# Patient Record
Sex: Male | Born: 1970 | Race: White | Hispanic: No | Smoking: Current every day smoker
Health system: Southern US, Community
[De-identification: ages and names within clinical notes are randomized; demographics above are authoritative.]

## PROBLEM LIST (undated history)

## (undated) DIAGNOSIS — J449 Chronic obstructive pulmonary disease, unspecified: Secondary | ICD-10-CM

## (undated) HISTORY — PX: OTHER SURGICAL HISTORY: SHX169

## (undated) HISTORY — DX: Chronic obstructive pulmonary disease, unspecified: J44.9

---

## 2006-09-22 ENCOUNTER — Emergency Department (HOSPITAL_COMMUNITY): Admission: EM | Admit: 2006-09-22 | Discharge: 2006-09-22 | Payer: Self-pay | Admitting: Emergency Medicine

## 2011-03-09 HISTORY — PX: COLONOSCOPY: SHX174

## 2014-02-21 ENCOUNTER — Encounter: Payer: Self-pay | Admitting: Cardiology

## 2014-02-21 HISTORY — PX: ESOPHAGOGASTRODUODENOSCOPY: SHX1529

## 2019-11-23 ENCOUNTER — Other Ambulatory Visit: Payer: Self-pay

## 2019-11-23 ENCOUNTER — Other Ambulatory Visit: Payer: Self-pay | Admitting: Podiatry

## 2019-11-23 ENCOUNTER — Ambulatory Visit (INDEPENDENT_AMBULATORY_CARE_PROVIDER_SITE_OTHER): Admitting: Podiatry

## 2019-11-23 DIAGNOSIS — S9782XA Crushing injury of left foot, initial encounter: Secondary | ICD-10-CM | POA: Diagnosis not present

## 2019-11-23 DIAGNOSIS — S92355A Nondisplaced fracture of fifth metatarsal bone, left foot, initial encounter for closed fracture: Secondary | ICD-10-CM | POA: Diagnosis not present

## 2019-11-23 DIAGNOSIS — M79672 Pain in left foot: Secondary | ICD-10-CM | POA: Diagnosis not present

## 2019-11-23 MED ORDER — OXYCODONE HCL 5 MG PO TABS: 5.0000 mg | ORAL_TABLET | ORAL | 0 refills | Status: DC | PRN

## 2019-11-23 NOTE — Progress Notes (Signed)
  Subjective:  Patient ID: Ivan Brown, male    DOB: 07-27-1971,  MRN: [id number]  Chief Complaint  Patient presents with  . Foot Injury    Lt foot lateral side injury (caught foot in a metal) x 1 dya; 8-9/10 shapr constatn pains -was seen at [facility] ED was rx oxycodone and dx with a fx Tx: crutches -pt in a splint   48 y.o. male presents with the above complaint. Work related injury.  Review of Systems: Negative except as noted in the HPI. Denies N/V/F/Ch.  No past medical history on file.  Current Outpatient Medications:  .  gabapentin (NEURONTIN) 300 MG capsule, TAKE 1 CAPSULE BY MOUTH EVERY DAY NIGHTLY FOR 7 DAYS. THEN INCREASE TO 2 CAPSULES BY MOUTH NIGHTLY, Disp: , Rfl:  .  hydrOXYzine (VISTARIL) 50 MG capsule, Take 50 mg by mouth at bedtime as needed., Disp: , Rfl:  .  LINZESS 290 MCG CAPS capsule, PLEASE SEE ATTACHED FOR DETAILED DIRECTIONS, Disp: , Rfl:  .  oxyCODONE (ROXICODONE) 5 MG immediate release tablet, Take 1 tablet (5 mg total) by mouth every 4 (four) hours as needed for severe pain., Disp: 12 tablet, Rfl: 0  Social History   Tobacco Use  Smoking Status Not on file    Allergies  Allergen Reactions  . Nsaids    Objective:  There were no vitals filed for this visit. There is no height or weight on file to calculate BMI. Constitutional Well developed. Well nourished.  Vascular Dorsalis pedis pulses palpable bilaterally. Posterior tibial pulses palpable bilaterally. Capillary refill normal to all digits.  No cyanosis or clubbing noted. Pedal hair growth normal.  Neurologic Normal speech. Oriented to person, place, and time. Epicritic sensation to light touch grossly present bilaterally.  Dermatologic Nails well groomed and normal in appearance. No open wounds. No skin lesions.  Orthopedic: Bruising contusion lateral foot left. POP L 5th met base. Decreased strength of peroneals 2/2 pain but appears intact.   Radiographs: Prior XR reviewed suspect fracture 5th  met base  Assessment:   1. Closed nondisplaced fracture of fifth metatarsal bone of left foot, initial encounter   2. Pain in left foot   3. Crushing injury of left foot, initial encounter    Plan:  Patient was evaluated and treated and all questions answered.  Fracture of left 5th metatarsal -XR reviewed as above -WB Status: WBAT in CAM boot. Boot dispensed. -Would benefit from non-operative management for this injury. Discussed that should the fracture appear unstable he may benefit from operative management.  Procedure: Soft Cast Application with Unna Boot Rationale: above injury Technique: Unna boot, cast padding, Coban compression dressing applied Disposition: Patient tolerated procedure well. NV status intact post-application.   Return in about 2 weeks (around 12/07/2019) for Left foot metatarsal fracture f/u; with XRs.

## 2019-11-24 ENCOUNTER — Telehealth: Payer: Self-pay | Admitting: *Deleted

## 2019-11-24 NOTE — Telephone Encounter (Signed)
That;s fine can you write the note?

## 2019-11-24 NOTE — Telephone Encounter (Signed)
Patient called wanting to know if we could change his out of work status to light duty in a seated position?  Patient stated that he did not turn in his injury to his employer for workman's comp.  Please advise.

## 2019-11-25 ENCOUNTER — Ambulatory Visit: Payer: Self-pay | Admitting: Podiatry

## 2019-11-26 ENCOUNTER — Telehealth: Payer: Self-pay | Admitting: Podiatry

## 2019-11-26 NOTE — Telephone Encounter (Signed)
Pt called to say that he is having severe pain and a lot of swelling in foot pt was seen on 11/25/2019 in [place] pt wants to know if he needs to be seen and if someone will call him back at this number [phone]

## 2019-11-27 ENCOUNTER — Encounter: Payer: Self-pay | Admitting: Podiatry

## 2019-11-27 MED ORDER — OXYCODONE-ACETAMINOPHEN 10-325 MG PO TABS: 1.0000 | ORAL_TABLET | Freq: Three times a day (TID) | ORAL | 0 refills | Status: AC | PRN

## 2019-11-29 ENCOUNTER — Encounter: Payer: Self-pay | Admitting: Gastroenterology

## 2019-11-29 ENCOUNTER — Telehealth: Payer: Self-pay | Admitting: Urology

## 2019-11-29 NOTE — Telephone Encounter (Signed)
Pt called and left a message that foot is numb and toes are white.  Tried to call pt back but no answer

## 2019-11-29 NOTE — Telephone Encounter (Signed)
Advised patient to remove [employer]. We can move up his appt.

## 2019-12-07 ENCOUNTER — Ambulatory Visit (INDEPENDENT_AMBULATORY_CARE_PROVIDER_SITE_OTHER)

## 2019-12-07 ENCOUNTER — Other Ambulatory Visit: Payer: Self-pay

## 2019-12-07 ENCOUNTER — Other Ambulatory Visit: Payer: Self-pay | Admitting: Podiatry

## 2019-12-07 ENCOUNTER — Ambulatory Visit (INDEPENDENT_AMBULATORY_CARE_PROVIDER_SITE_OTHER): Admitting: Podiatry

## 2019-12-07 DIAGNOSIS — M79672 Pain in left foot: Secondary | ICD-10-CM

## 2019-12-07 DIAGNOSIS — S92355A Nondisplaced fracture of fifth metatarsal bone, left foot, initial encounter for closed fracture: Secondary | ICD-10-CM

## 2019-12-10 ENCOUNTER — Telehealth: Payer: Self-pay | Admitting: Podiatry

## 2019-12-10 NOTE — Telephone Encounter (Signed)
Called and asked Nunzio Cory if she could fax me a request with pt's workers comp claim number to met at [phone] and then I would fax her his records. Confirmed his next appointment with [place] is 12/20/19 at 3 pm.

## 2019-12-10 NOTE — Telephone Encounter (Signed)
I'm calling to get medical records on Mr. Ivan Brown in regards to his workers compensation claim. Please call me back at [phone].

## 2019-12-13 ENCOUNTER — Telehealth: Payer: Self-pay | Admitting: Podiatry

## 2019-12-13 NOTE — Telephone Encounter (Signed)
I'm calling because I called and sent a fax to obtain [name] medical records on Friday 18 December. I told Lorel Monaco waiting on the doctor to finish dictating ov note from 15 December and once its completed I would get it faxed to her.

## 2019-12-20 ENCOUNTER — Other Ambulatory Visit: Payer: Self-pay | Admitting: Podiatry

## 2019-12-20 ENCOUNTER — Ambulatory Visit (INDEPENDENT_AMBULATORY_CARE_PROVIDER_SITE_OTHER): Payer: BLUE CROSS/BLUE SHIELD | Admitting: Podiatry

## 2019-12-20 DIAGNOSIS — M79672 Pain in left foot: Secondary | ICD-10-CM

## 2019-12-20 DIAGNOSIS — Z5329 Procedure and treatment not carried out because of patient's decision for other reasons: Secondary | ICD-10-CM

## 2019-12-20 NOTE — Progress Notes (Signed)
No show for appt. 

## 2019-12-21 ENCOUNTER — Other Ambulatory Visit: Payer: Self-pay | Admitting: Podiatry

## 2019-12-21 ENCOUNTER — Other Ambulatory Visit: Payer: Self-pay

## 2019-12-21 ENCOUNTER — Ambulatory Visit (INDEPENDENT_AMBULATORY_CARE_PROVIDER_SITE_OTHER): Admitting: Podiatry

## 2019-12-21 ENCOUNTER — Ambulatory Visit (INDEPENDENT_AMBULATORY_CARE_PROVIDER_SITE_OTHER): Payer: BLUE CROSS/BLUE SHIELD

## 2019-12-21 DIAGNOSIS — S92355A Nondisplaced fracture of fifth metatarsal bone, left foot, initial encounter for closed fracture: Secondary | ICD-10-CM | POA: Diagnosis not present

## 2019-12-21 NOTE — Progress Notes (Signed)
  Subjective:  Patient ID: Ivan Brown, male    DOB: 08/24/1971,  MRN: [id number]  No chief complaint on file.  48 y.o. male presents for f/u of left foot injury. Still having a lot of pain. Throbbing 6/10 sharp pain, up to the knee. Review of Systems: Negative except as noted in the HPI. Denies N/V/F/Ch.  No past medical history on file.  Current Outpatient Medications:  .  gabapentin (NEURONTIN) 300 MG capsule, TAKE 1 CAPSULE BY MOUTH EVERY DAY NIGHTLY FOR 7 DAYS. THEN INCREASE TO 2 CAPSULES BY MOUTH NIGHTLY, Disp: , Rfl:  .  hydrOXYzine (VISTARIL) 50 MG capsule, Take 50 mg by mouth at bedtime as needed., Disp: , Rfl:  .  LINZESS 290 MCG CAPS capsule, PLEASE SEE ATTACHED FOR DETAILED DIRECTIONS, Disp: , Rfl:  .  oxyCODONE (ROXICODONE) 5 MG immediate release tablet, Take 1 tablet (5 mg total) by mouth every 4 (four) hours as needed for severe pain., Disp: 12 tablet, Rfl: 0  Social History   Tobacco Use  Smoking Status Not on file    Allergies  Allergen Reactions  . Nsaids    Objective:  There were no vitals filed for this visit. There is no height or weight on file to calculate BMI. Constitutional Well developed. Well nourished.  Vascular Dorsalis pedis pulses palpable bilaterally. Posterior tibial pulses palpable bilaterally. Capillary refill normal to all digits.  No cyanosis or clubbing noted. Pedal hair growth normal.  Neurologic Normal speech. Oriented to person, place, and time. Epicritic sensation to light touch grossly present bilaterally.  Dermatologic Nails well groomed and normal in appearance. No open wounds. No skin lesions.  Orthopedic: Bruising contusion resolved. Still with POP left 5th met base.   Radiographs: XR taken and reviewed interval healing of fracture noted, not fully healed. Assessment:   1. Closed nondisplaced fracture of fifth metatarsal bone of left foot, initial encounter    Plan:  Patient was evaluated and treated and all questions  answered.  Fracture of left 5th metatarsal -XR reviewed as above -Reapply short leg cast -Continue NWB  Procedure: Short Leg Cast Application  Rationale: above injury Technique: Well padded short leg cast applied with 3 layers of fiberglass casting material. Disposition: Patient tolerated procedure well. NV status intact post-application.    No follow-ups on file.

## 2019-12-23 ENCOUNTER — Ambulatory Visit (INDEPENDENT_AMBULATORY_CARE_PROVIDER_SITE_OTHER): Payer: BLUE CROSS/BLUE SHIELD | Admitting: Gastroenterology

## 2019-12-23 ENCOUNTER — Encounter: Payer: Self-pay | Admitting: Gastroenterology

## 2019-12-23 ENCOUNTER — Other Ambulatory Visit: Payer: Self-pay

## 2019-12-23 VITALS — BP 100/72 | HR 78 | Temp 97.8°F | Ht 71.0 in | Wt 129.5 lb

## 2019-12-23 DIAGNOSIS — Z1159 Encounter for screening for other viral diseases: Secondary | ICD-10-CM

## 2019-12-23 DIAGNOSIS — R634 Abnormal weight loss: Secondary | ICD-10-CM | POA: Diagnosis not present

## 2019-12-23 DIAGNOSIS — K5909 Other constipation: Secondary | ICD-10-CM | POA: Diagnosis not present

## 2019-12-23 DIAGNOSIS — K625 Hemorrhage of anus and rectum: Secondary | ICD-10-CM | POA: Diagnosis not present

## 2019-12-23 MED ORDER — MOTEGRITY 2 MG PO TABS: 2.0000 mg | ORAL_TABLET | Freq: Every day | ORAL | 3 refills | Status: AC

## 2019-12-23 MED ORDER — MOTEGRITY 2 MG PO TABS: 2.0000 mg | ORAL_TABLET | Freq: Every day | ORAL | 0 refills | Status: AC

## 2019-12-23 MED ORDER — CLENPIQ 10-3.5-12 MG-GM -GM/160ML PO SOLN: 1.0000 | Freq: Once | ORAL | 0 refills | Status: AC

## 2019-12-23 NOTE — Patient Instructions (Signed)
If you are age 46 or older, your body mass index should be between 23-30. Your Body mass index is 18.06 kg/m. If this is out of the aforementioned range listed, please consider follow up with your Primary Care Provider.  If you are age 30 or younger, your body mass index should be between 19-25. Your Body mass index is 18.06 kg/m. If this is out of the aformentioned range listed, please consider follow up with your Primary Care Provider.   You have been scheduled for a colonoscopy. Please follow written instructions given to you at your visit today.  Please pick up your prep supplies at the pharmacy within the next 1-3 days. If you use inhalers (even only as needed), please bring them with you on the day of your procedure. Your physician has requested that you go to www.startemmi.com and enter the access code given to you at your visit today. This web site gives a general overview about your procedure. However, you should still follow specific instructions given to you by our office regarding your preparation for the procedure.  We have given you samples of the following medication to take: Clenpiq Motegrity  We have sent the following medications to your pharmacy for you to pick up at your convenience: Motegrity  Please go to the lab at [facility] ([place] You will need to go to level "B", you do not need an appointment for this. Hours available are 7:30 am - 4:30 pm.   Thank you,  Dr. Jackquline Denmark

## 2019-12-23 NOTE — Progress Notes (Signed)
Chief Complaint: Constipation/rectal bleeding  Referring Provider:  Ailene Rud, NP      ASSESSMENT AND PLAN;   #1. Chronic constipation. Neg colon in the past but poor preparation.  #2. Rectal bleeding. D/d hoids, AVMs, colitis, polyps, stercoral ulcers etc, r/o colonic neoplasms or IBD.  #3. Wt loss   Plan: -Proceed with colonoscopy (2-day prep). Discussed risks & benefits. (Risks including rare perforation req laparotomy, bleeding after bx/polypectomy req blood transfusion, rarely missing neoplasms, risks of anesthesia/sedation). Benefits outweigh the risks. Patient agrees to proceed. All the questions were answered. Consent forms given for review. -CBC, CMP, TSH, celiac screen. -Motegrity 2mg  po qd. samples given.  I have also explained side effects including rare depression/suicidal tendency.  Information was given.  If he has any neg feelings, he will probably get in touch with [place]. -Continue linzess for now. -Stop smoking. -Check Wt every week and record. If continued wt loss, will proceed with CT abdo/pelvis with PO/IV contrast. D/w patient in detail. All the contact numbers were given. Patient knows how to get in touch with [place]. -No NSAIDs.  Addendum: -Got some records from [facility].  Colonoscopy 02/2011-very poor preparation.  Questionable mild colitis.  Biopsies were negative. Nl TI -EGD March 2015: Gastric ulcers. Neg Bx for HP. HPI:    Ivan Brown is a 48 y.o. male  Accompanied by his wife (on papers he marked divorced) Longstanding history of constipation, worse over last 8 years, associated with generalized abdominal pain, abdominal bloating.  Currently having bowel movements at the frequency of 1/2-3 weeks.  Occasional rectal bleeding.  Has tried multiple medications including MiraLAX, lactulose, over-the-counter medications like stool softeners without any relief.  Has been on Linzess 290 MCG twice daily without any relief.  Rectal bleeding is mostly  bright red in color, gets somewhat better with suppositories.  However, at times mixed with the stools.  Had rectal bleeding and colonoscopy performed in [facility] over 8 to 10 years ago-per patient was negative.  Records are awaited.  Has lost weight from 150lb to 130lb over last 1 year.  Denies use of pain medications including nonsteroidals.  Denies having any upper GI symptoms including nausea, vomiting, heartburn, odynophagia or dysphagia.  No fever chills or night sweats.  Has seen Dr. Melina Copa and Dr. Lyda Jester in the past.  Advised colonoscopy.  Past Medical History:  Diagnosis Date  . COPD (chronic obstructive pulmonary disease) ([place])     Past Surgical History:  Procedure Laterality Date  . COLONOSCOPY     x2 last one in 2015 said they cut something done possibly colon polyps   . ESOPHAGOGASTRODUODENOSCOPY    . OTHER SURGICAL HISTORY Right    right arm has plate and screws and shoulder    Family History  Problem Relation Age of Onset  . Colon cancer Father        dx at 57   . Lung cancer Maternal Aunt   . Brain cancer Maternal Aunt   . Esophageal cancer Neg Hx     Social History   Tobacco Use  . Smoking status: Current Every Day Smoker  . Smokeless tobacco: Never Used  Substance Use Topics  . Alcohol use: Yes  . Drug use: Not Currently    Current Outpatient Medications  Medication Sig Dispense Refill  . BREO ELLIPTA 100-25 MCG/INH AEPB 1 puff daily.    Marland Kitchen gabapentin (NEURONTIN) 300 MG capsule Take 600 mg by mouth at bedtime.     Marland Kitchen Rolan Lipa  290 MCG CAPS capsule Take 580 mcg by mouth daily.      No current facility-administered medications for this visit.    Allergies  Allergen Reactions  . Nsaids     Review of Systems:  Constitutional: Denies fever, chills, diaphoresis, appetite change and fatigue.  HEENT: Denies photophobia, eye pain, redness, hearing loss, ear pain, congestion, sore throat, rhinorrhea, sneezing, mouth sores, neck pain,  neck stiffness and tinnitus.   Respiratory: Has COPD. Cardiovascular: Denies chest pain, palpitations and leg swelling.  Genitourinary: Denies dysuria, urgency, frequency, hematuria, flank pain and difficulty urinating.  Musculoskeletal: Denies myalgias, back pain, joint swelling, arthralgias and gait problem.  Skin: No rash.  Neurological: Denies dizziness, seizures, syncope, weakness, light-headedness, numbness and headaches.  Hematological: Denies adenopathy. Easy bruising, personal or family bleeding history  Psychiatric/Behavioral: Some anxiety, no depression     Physical Exam:    BP 100/72   Pulse 78   Temp 97.8 F (36.6 C)   Ht 5\' 11"  (1.803 m)   Wt 129 lb 8 oz (58.7 kg)   BMI 18.06 kg/m  Wt Readings from Last 3 Encounters:  12/23/19 129 lb 8 oz (58.7 kg)   Constitutional:  Well-developed, in no acute distress. Psychiatric: Normal mood and affect. Behavior is normal. HEENT: Pupils normal.  Conjunctivae are normal. No scleral icterus. Neck supple.  Cardiovascular: Normal rate, regular rhythm. No edema Pulmonary/chest: Decreased breath sounds.  No wheezing. Abdominal: Soft, nondistended. Nontender. Bowel sounds active throughout. There are no masses palpable. No hepatomegaly. Rectal:  defered Neurological: Alert and oriented to person place and time. Skin: Skin is warm and dry. No rashes noted.  Data Reviewed: I have personally reviewed following labs and imaging studies      Carmell Austria, MD 12/23/2019, 9:21 AM  Cc: Ailene Rud, NP

## 2019-12-26 NOTE — Progress Notes (Signed)
  Subjective:  Patient ID: Ivan Brown, male    DOB: March 10, 1971,  MRN: [id number]  No chief complaint on file.  49 y.o. male presents with the above complaint.  States that it was doing better until Wednesday when the 29-year-old dropped a piece of wood on his foot reports 7 out of 10 sharp constant pain that radiates up to the knee.  Swelling is better denies new issues.  Has been using the boot icing and resting  Review of Systems: Negative except as noted in the HPI. Denies N/V/F/Ch.  Past Medical History:  Diagnosis Date  . COPD (chronic obstructive pulmonary disease) ([facility])     Current Outpatient Medications:  .  BREO ELLIPTA 100-25 MCG/INH AEPB, 1 puff daily., Disp: , Rfl:  .  gabapentin (NEURONTIN) 300 MG capsule, Take 600 mg by mouth at bedtime. , Disp: , Rfl:  .  LINZESS 290 MCG CAPS capsule, Take 580 mcg by mouth daily. , Disp: , Rfl:  .  Prucalopride Succinate (MOTEGRITY) 2 MG TABS, Take 1 tablet (2 mg total) by mouth daily., Disp: 30 tablet, Rfl: 3 .  Prucalopride Succinate (MOTEGRITY) 2 MG TABS, Take 1 tablet (2 mg total) by mouth daily., Disp: 28 tablet, Rfl: 0  Social History   Tobacco Use  Smoking Status Current Every Day Smoker  Smokeless Tobacco Never Used    Allergies  Allergen Reactions  . Nsaids    Objective:  There were no vitals filed for this visit. There is no height or weight on file to calculate BMI. Constitutional Well developed. Well nourished.  Vascular Dorsalis pedis pulses palpable bilaterally. Posterior tibial pulses palpable bilaterally. Capillary refill normal to all digits.  No cyanosis or clubbing noted. Pedal hair growth normal.  Neurologic Normal speech. Oriented to person, place, and time. Epicritic sensation to light touch grossly present bilaterally.  Dermatologic Nails well groomed and normal in appearance. No open wounds. No skin lesions.  Orthopedic:  Contusion and bruising reduced no edema pain to motion at the base of the left  fifth metatarsal   Radiographs: Prior XR reviewed suspect fracture 5th met base  Assessment:   1. Closed nondisplaced fracture of fifth metatarsal bone of left foot, initial encounter    Plan:  Patient was evaluated and treated and all questions answered.  Fracture of left 5th metatarsal -Edema reduced.  Patient would like to proceed with cast application.  Cast applied with 3 layers of fiberglass casting as below  Procedure: Short Leg Cast Application  Rationale: above injury Technique: Well padded short leg cast applied with 3 layers of fiberglass casting material. Disposition: Patient tolerated procedure well. NV status intact post-application.   Return in about 2 weeks (around 12/21/2019) for Cast change, Left, with XRs.

## 2019-12-28 ENCOUNTER — Telehealth: Payer: Self-pay | Admitting: Podiatry

## 2019-12-28 ENCOUNTER — Encounter: Payer: Self-pay | Admitting: Gastroenterology

## 2019-12-28 ENCOUNTER — Encounter: Payer: Self-pay | Admitting: Podiatry

## 2019-12-28 NOTE — Telephone Encounter (Signed)
Ivan Brown with [employer] for patients workers compensation is requesting a letter of patients work status be faxed to her at [phone]. Any questions, please call her at [phone].

## 2019-12-28 NOTE — Telephone Encounter (Signed)
I anticipate him out of work for at least another 2 weeks. Will re-eval on 1/11

## 2019-12-28 NOTE — Telephone Encounter (Signed)
Thank you. I will type that up and fax to [facility].

## 2019-12-31 ENCOUNTER — Other Ambulatory Visit: Payer: Self-pay | Admitting: Gastroenterology

## 2019-12-31 ENCOUNTER — Ambulatory Visit (INDEPENDENT_AMBULATORY_CARE_PROVIDER_SITE_OTHER): Payer: BLUE CROSS/BLUE SHIELD

## 2019-12-31 ENCOUNTER — Other Ambulatory Visit (INDEPENDENT_AMBULATORY_CARE_PROVIDER_SITE_OTHER): Payer: BLUE CROSS/BLUE SHIELD

## 2019-12-31 DIAGNOSIS — R634 Abnormal weight loss: Secondary | ICD-10-CM

## 2019-12-31 DIAGNOSIS — Z1159 Encounter for screening for other viral diseases: Secondary | ICD-10-CM

## 2019-12-31 DIAGNOSIS — K5909 Other constipation: Secondary | ICD-10-CM

## 2019-12-31 LAB — CBC WITH DIFFERENTIAL/PLATELET
Basophils Absolute: 0.1 10*3/uL (ref 0.0–0.1)
Basophils Relative: 0.9 % (ref 0.0–3.0)
Eosinophils Absolute: 0.2 10*3/uL (ref 0.0–0.7)
Eosinophils Relative: 1.7 % (ref 0.0–5.0)
HCT: 42.4 % (ref 39.0–52.0)
Hemoglobin: 14.2 g/dL (ref 13.0–17.0)
Lymphocytes Relative: 33.6 % (ref 12.0–46.0)
Lymphs Abs: 3 10*3/uL (ref 0.7–4.0)
MCHC: 33.5 g/dL (ref 30.0–36.0)
MCV: 85.8 fl (ref 78.0–100.0)
Monocytes Absolute: 0.7 10*3/uL (ref 0.1–1.0)
Monocytes Relative: 7.8 % (ref 3.0–12.0)
Neutro Abs: 5 10*3/uL (ref 1.4–7.7)
Neutrophils Relative %: 56 % (ref 43.0–77.0)
Platelets: 351 10*3/uL (ref 150.0–400.0)
RBC: 4.94 Mil/uL (ref 4.22–5.81)
RDW: 16 % — ABNORMAL HIGH (ref 11.5–15.5)
WBC: 8.9 10*3/uL (ref 4.0–10.5)

## 2019-12-31 LAB — COMPREHENSIVE METABOLIC PANEL
ALT: 10 U/L (ref 0–53)
AST: 15 U/L (ref 0–37)
Albumin: 4 g/dL (ref 3.5–5.2)
Alkaline Phosphatase: 76 U/L (ref 39–117)
BUN: 22 mg/dL (ref 6–23)
CO2: 24 mEq/L (ref 19–32)
Calcium: 8.9 mg/dL (ref 8.4–10.5)
Chloride: 107 mEq/L (ref 96–112)
Creatinine, Ser: 0.9 mg/dL (ref 0.40–1.50)
GFR: 90.5 mL/min (ref >60.00)
Glucose, Bld: 74 mg/dL (ref 70–99)
Potassium: 4.2 mEq/L (ref 3.5–5.1)
Sodium: 140 mEq/L (ref 135–145)
Total Bilirubin: 0.2 mg/dL (ref 0.2–1.2)
Total Protein: 6.9 g/dL (ref 6.0–8.3)

## 2019-12-31 LAB — TSH: TSH: 2.75 u[IU]/mL (ref 0.35–4.50)

## 2020-01-03 ENCOUNTER — Other Ambulatory Visit: Payer: Self-pay | Admitting: Podiatry

## 2020-01-03 ENCOUNTER — Ambulatory Visit (INDEPENDENT_AMBULATORY_CARE_PROVIDER_SITE_OTHER): Admitting: Podiatry

## 2020-01-03 ENCOUNTER — Other Ambulatory Visit: Payer: Self-pay

## 2020-01-03 ENCOUNTER — Ambulatory Visit (INDEPENDENT_AMBULATORY_CARE_PROVIDER_SITE_OTHER): Payer: BLUE CROSS/BLUE SHIELD

## 2020-01-03 DIAGNOSIS — S92352A Displaced fracture of fifth metatarsal bone, left foot, initial encounter for closed fracture: Secondary | ICD-10-CM | POA: Diagnosis not present

## 2020-01-03 DIAGNOSIS — S92355D Nondisplaced fracture of fifth metatarsal bone, left foot, subsequent encounter for fracture with routine healing: Secondary | ICD-10-CM | POA: Diagnosis not present

## 2020-01-03 DIAGNOSIS — Z9889 Other specified postprocedural states: Secondary | ICD-10-CM

## 2020-01-03 LAB — SARS CORONAVIRUS 2 (TAT 6-24 HRS): SARS Coronavirus 2: NEGATIVE

## 2020-01-03 NOTE — Progress Notes (Signed)
  Subjective:  Patient ID: Byren Pankow, male    DOB: 21-Dec-1971,  MRN: [id number]  Chief Complaint  Patient presents with  . Foot Injury    F/U LT 5th fx Pt. states," when it gets cold it starts to hurt real bad and it starts to jerk; 8/10 sharp constatn apin." tx: crutches -pt denies N/V/F/Ch     49 y.o. male presents with the above complaint. History confirmed with patient. DOI: 11/22/2019  Objective:  Physical Exam: warm, good capillary refill, no trophic changes or ulcerative lesions, normal DP and PT pulses and normal sensory exam. Left Foot: tenderness 5th met base, peroneal tendons   Radiographs: X-ray of the right foot: progression of left 5th metatarsal fx healing   Assessment:   1. Post-operative state   2. Closed nondisplaced fracture of fifth metatarsal bone of left foot with routine healing, subsequent encounter     Plan:  Patient was evaluated and treated and all questions answered.  Fracture of Lt 5th metatarsal -XR reviewed -Start WB in CAM -F/u in 2 weeks for new XR -Possible return to work in 3 weeks.  Return in about 2 weeks (around 01/17/2020) for Post-Op (with XRs).   MDM

## 2020-01-04 ENCOUNTER — Emergency Department (HOSPITAL_COMMUNITY): Payer: BLUE CROSS/BLUE SHIELD

## 2020-01-04 ENCOUNTER — Encounter (HOSPITAL_COMMUNITY): Payer: Self-pay

## 2020-01-04 ENCOUNTER — Encounter: Payer: Self-pay | Admitting: Gastroenterology

## 2020-01-04 ENCOUNTER — Ambulatory Visit
Admission: RE | Admit: 2020-01-04 | Discharge: 2020-01-04 | Disposition: A | Discharge status: Home / Self Care | Payer: BLUE CROSS/BLUE SHIELD | Source: Ambulatory Visit | Attending: Gastroenterology | Admitting: Gastroenterology

## 2020-01-04 ENCOUNTER — Telehealth: Payer: Self-pay | Admitting: Podiatry

## 2020-01-04 ENCOUNTER — Emergency Department (HOSPITAL_COMMUNITY)
Admission: EM | Admit: 2020-01-04 | Discharge: 2020-01-04 | Disposition: A | Discharge status: Home / Self Care | Payer: BLUE CROSS/BLUE SHIELD | Attending: Emergency Medicine | Admitting: Emergency Medicine

## 2020-01-04 ENCOUNTER — Other Ambulatory Visit: Payer: Self-pay

## 2020-01-04 ENCOUNTER — Ambulatory Visit (AMBULATORY_SURGERY_CENTER): Payer: BLUE CROSS/BLUE SHIELD | Admitting: Gastroenterology

## 2020-01-04 VITALS — BP 111/75 | HR 55 | Temp 98.3°F | Resp 10 | Ht 71.0 in | Wt 129.0 lb

## 2020-01-04 DIAGNOSIS — R1084 Generalized abdominal pain: Secondary | ICD-10-CM

## 2020-01-04 DIAGNOSIS — K648 Other hemorrhoids: Secondary | ICD-10-CM

## 2020-01-04 DIAGNOSIS — K625 Hemorrhage of anus and rectum: Secondary | ICD-10-CM | POA: Diagnosis present

## 2020-01-04 DIAGNOSIS — R109 Unspecified abdominal pain: Secondary | ICD-10-CM

## 2020-01-04 DIAGNOSIS — J449 Chronic obstructive pulmonary disease, unspecified: Secondary | ICD-10-CM | POA: Diagnosis not present

## 2020-01-04 DIAGNOSIS — F17210 Nicotine dependence, cigarettes, uncomplicated: Secondary | ICD-10-CM | POA: Diagnosis not present

## 2020-01-04 DIAGNOSIS — R1032 Left lower quadrant pain: Secondary | ICD-10-CM | POA: Diagnosis not present

## 2020-01-04 DIAGNOSIS — K5900 Constipation, unspecified: Secondary | ICD-10-CM

## 2020-01-04 DIAGNOSIS — D125 Benign neoplasm of sigmoid colon: Secondary | ICD-10-CM

## 2020-01-04 DIAGNOSIS — R1012 Left upper quadrant pain: Secondary | ICD-10-CM | POA: Insufficient documentation

## 2020-01-04 DIAGNOSIS — K635 Polyp of colon: Secondary | ICD-10-CM | POA: Diagnosis not present

## 2020-01-04 LAB — BASIC METABOLIC PANEL
Anion gap: 7 (ref 5–15)
BUN: 21 mg/dL — ABNORMAL HIGH (ref 6–20)
CO2: 25 mmol/L (ref 22–32)
Calcium: 8.3 mg/dL — ABNORMAL LOW (ref 8.9–10.3)
Chloride: 110 mmol/L (ref 98–111)
Creatinine, Ser: 0.85 mg/dL (ref 0.61–1.24)
GFR calc Af Amer: >60 mL/min (ref >60)
GFR calc non Af Amer: >60 mL/min (ref >60)
Glucose, Bld: 81 mg/dL (ref 70–99)
Potassium: 4.1 mmol/L (ref 3.5–5.1)
Sodium: 142 mmol/L (ref 135–145)

## 2020-01-04 LAB — CBC WITH DIFFERENTIAL/PLATELET
Abs Immature Granulocytes: 0.02 10*3/uL (ref 0.00–0.07)
Basophils Absolute: 0.1 10*3/uL (ref 0.0–0.1)
Basophils Relative: 1 %
Eosinophils Absolute: 0.1 10*3/uL (ref 0.0–0.5)
Eosinophils Relative: 1 %
HCT: 43.1 % (ref 39.0–52.0)
Hemoglobin: 13.7 g/dL (ref 13.0–17.0)
Immature Granulocytes: 0 %
Lymphocytes Relative: 25 %
Lymphs Abs: 1.8 10*3/uL (ref 0.7–4.0)
MCH: 28.9 pg (ref 26.0–34.0)
MCHC: 31.8 g/dL (ref 30.0–36.0)
MCV: 90.9 fL (ref 80.0–100.0)
Monocytes Absolute: 0.5 10*3/uL (ref 0.1–1.0)
Monocytes Relative: 7 %
Neutro Abs: 5 10*3/uL (ref 1.7–7.7)
Neutrophils Relative %: 66 %
Platelets: 331 10*3/uL (ref 150–400)
RBC: 4.74 MIL/uL (ref 4.22–5.81)
RDW: 15.6 % — ABNORMAL HIGH (ref 11.5–15.5)
WBC: 7.5 10*3/uL (ref 4.0–10.5)
nRBC: 0 % (ref 0.0–0.2)

## 2020-01-04 LAB — LIPASE, BLOOD: Lipase: 21 U/L (ref 11–51)

## 2020-01-04 LAB — HEPATIC FUNCTION PANEL
ALT: 10 U/L (ref 0–44)
AST: 14 U/L — ABNORMAL LOW (ref 15–41)
Albumin: 3.4 g/dL — ABNORMAL LOW (ref 3.5–5.0)
Alkaline Phosphatase: 65 U/L (ref 38–126)
Bilirubin, Direct: <0.1 mg/dL (ref 0.0–0.2)
Total Bilirubin: 0.5 mg/dL (ref 0.3–1.2)
Total Protein: 6.5 g/dL (ref 6.5–8.1)

## 2020-01-04 LAB — CELIAC PANEL 10
Antigliadin Abs, IgA: 4 units (ref 0–19)
Endomysial IgA: NEGATIVE
Gliadin IgG: 4 units (ref 0–19)
IgA/Immunoglobulin A, Serum: 198 mg/dL (ref 90–386)
Tissue Transglut Ab: 2 U/mL (ref 0–5)
Transglutaminase IgA: <2 U/mL (ref 0–3)

## 2020-01-04 MED ORDER — FENTANYL CITRATE (PF) 100 MCG/2ML IJ SOLN
200.0000 ug | Freq: Once | INTRAMUSCULAR | Status: AC
  Administered 2020-01-04: 13:00:00 200 ug via INTRAVENOUS

## 2020-01-04 MED ORDER — HYDROMORPHONE HCL 1 MG/ML IJ SOLN
1.0000 mg | Freq: Once | INTRAMUSCULAR | Status: AC
  Administered 2020-01-04: 1 mg via INTRAVENOUS
  Filled 2020-01-04: qty 1

## 2020-01-04 MED ORDER — SODIUM CHLORIDE (PF) 0.9 % IJ SOLN
INTRAMUSCULAR | Status: AC
  Filled 2020-01-04: qty 50

## 2020-01-04 MED ORDER — ONDANSETRON 4 MG PO TBDP: 4.0000 mg | ORAL_TABLET | Freq: Three times a day (TID) | ORAL | 0 refills | Status: DC | PRN

## 2020-01-04 MED ORDER — SODIUM CHLORIDE 0.9 % IV SOLN: 500.0000 mL | Freq: Once | INTRAVENOUS | Status: DC

## 2020-01-04 MED ORDER — IOHEXOL 300 MG/ML  SOLN
100.0000 mL | Freq: Once | INTRAMUSCULAR | Status: AC | PRN
  Administered 2020-01-04: 100 mL via INTRAVENOUS

## 2020-01-04 MED ORDER — ONDANSETRON HCL 4 MG/2ML IJ SOLN
4.0000 mg | Freq: Once | INTRAMUSCULAR | Status: AC
  Administered 2020-01-04: 4 mg via INTRAVENOUS
  Filled 2020-01-04: qty 2

## 2020-01-04 NOTE — ED Triage Notes (Signed)
Pt sent by [facility], Pt had colonoscopy today, no report of problem with procedure. Pt c/o of left sided abdominal pain after waking up from anesthesia. Hx of chronic abdominal pain.  BP 116/72 HR 59 RR 14 Sp02 99 RA Temp 98.1  22 R forearm 26mcgm Fentanyl 581ml NS enroute

## 2020-01-04 NOTE — Progress Notes (Signed)
Called to room to assist during endoscopic procedure.  Patient ID and intended procedure confirmed with present staff. Received instructions for my participation in the procedure from the performing physician.  

## 2020-01-04 NOTE — Progress Notes (Signed)
Pt's states no medical or surgical changes since previsit or office visit. 

## 2020-01-04 NOTE — Progress Notes (Signed)
Patient came to Recovery Room at 1205 after procedure, 1206 I documented his vitals signs and did an assessment after Santiago Glad hooked him to the monitor.  Around 1210, Dr. Lyndel Safe came in to speak to him, so I tried to rouse him up, he was still sleepy.  He all of a sudden started crying out he had a bad pain in his stomach.  We helped him switch positions to expel gas.  This did not help, he was assessed by Dr. Lyndel Safe then who said he was soft and didn't have very much gas.  He continued to get restless, and continued to say he felt like a knife in his stomach.  We gave him 117mcg of fentanyl IV, assessed and tried to speak to him and get another assessment, he kept with his restlessness and said it still was awful pain.  We spoke to him about an abdominal x ray, and that he would need to stop moving so much to get one.  He said he couldn't and that it was so much pain.  We gave him another 157mcg of fentanyl IV.  After waiting to see if he would calm, the charge nurse and the nurse manager, and Dr. Lyndel Safe decided it would be good to transfer him to the hospital for the Xray.  This was initiated, and we assessed him while waiting, and EMS took him at 1255 for transfer.  His wife Barnett Applebaum, came back to the recovery to see if she could help calm him about 1230, and she went with him over to [facility]

## 2020-01-04 NOTE — Progress Notes (Signed)
CT scan AP done today: neg for any acute abnormalities.  No perforation. Discussed in detail with Barnett Applebaum. Per Barnett Applebaum, he always had similar problems when he is waking up from anesthesia.  Certainly, we had to make sure that he was okay.  His labs are still pending.  RG  Addendum: Labs were all normal including CBC, CMP.

## 2020-01-04 NOTE — Discharge Instructions (Signed)
Please follow-up with gastroenterology as well as your primary doctor regarding symptoms today.  Recommend taking pain medicine as prescribed.  Return to ER for worsening pain, fever, other new concerning symptom.

## 2020-01-04 NOTE — Telephone Encounter (Signed)
I'm calling to request the ov notes from 01/03/2020 be faxed to me for pt's workers compensation claim. Please fax those to [phone].

## 2020-01-04 NOTE — ED Notes (Signed)
Patient transported to CT 

## 2020-01-04 NOTE — Op Note (Signed)
[facility] Patient Name: Ivan Brown Procedure Date: 01/04/2020 11:31 AM MRN: [id number] Endoscopist: Jackquline Denmark , MD Age: 49 Referring MD:  Date of Birth: 03-10-1971 Gender: Male Account #: [id number] Procedure:                Colonoscopy Indications:              Rectal bleeding. H/O chronic constipation. History                            of weight loss. FH of colon cancer (dad at age 55) Medicines:                Monitored Anesthesia Care Procedure:                Pre-Anesthesia Assessment:                           - Prior to the procedure, a History and Physical                            was performed, and patient medications and                            allergies were reviewed. The patient's tolerance of                            previous anesthesia was also reviewed. The risks                            and benefits of the procedure and the sedation                            options and risks were discussed with the patient.                            All questions were answered, and informed consent                            was obtained. Prior Anticoagulants: The patient has                            taken no previous anticoagulant or antiplatelet                            agents. ASA Grade Assessment: II - A patient with                            mild systemic disease. After reviewing the risks                            and benefits, the patient was deemed in                            satisfactory condition to undergo the procedure.  After obtaining informed consent, the colonoscope                            was passed under direct vision. Throughout the                            procedure, the patient's blood pressure, pulse, and                            oxygen saturations were monitored continuously. The                            Colonoscope was introduced through the anus and                            advanced to the  the cecum, identified by                            appendiceal orifice and ileocecal valve. The                            terminal ileum was just intubated. The colonoscopy                            was performed without difficulty. The patient                            tolerated the procedure well. The quality of the                            bowel preparation was adequate to identify polyps.                            Some adherent stool in some areas of the colon.                            Aggressive suctioning and aspiration was performed.                            Overall examination was adequate. The ileocecal                            valve, appendiceal orifice, and rectum were                            photographed. Scope In: 11:38:10 AM Scope Out: 11:59:03 AM Scope Withdrawal Time: 0 hours 16 minutes 52 seconds  Total Procedure Duration: 0 hours 20 minutes 53 seconds  Findings:                 Two sessile polyps were found in the mid sigmoid                            colon and distal sigmoid colon. The polyps were 4  to 6 mm in size. These polyps were removed with a                            cold snare. Resection and retrieval were complete.                           A 10 mm polyp was found in the recto-sigmoid colon.                            The polyp was sessile. The polyp was removed with a                            hot snare. Resection and retrieval were complete.                           Non-bleeding internal hemorrhoids were found during                            perianal exam. The hemorrhoids were small.                           The exam was otherwise without abnormality on                            direct and retroflexion views. The colon was                            somewhat redundant. Complications:            No immediate complications. Estimated Blood Loss:     Estimated blood loss: none. Impression:                -Colonic polyps s/p polypectomy.                           -Non-bleeding internal hemorrhoids (likely etiology                            of rectal bleeding).                           -Otherwise normal colonoscopy. Recommendation:           - Patient has a contact number available for                            emergencies. The signs and symptoms of potential                            delayed complications were discussed with the                            patient. Return to normal activities tomorrow.                            Written discharge instructions were provided  to the                            patient.                           - Resume previous diet.                           - Continue present medications.                           - Restart MiraLAX 17 g p.o. twice daily with 8                            ounces of water.                           - No aspirin, ibuprofen, naproxen, or other                            non-steroidal anti-inflammatory drugs.                           - Await pathology results.                           - Repeat colonoscopy for surveillance based on                            pathology results.                           - Return to GI clinic in 12 weeks. Jackquline Denmark, MD 01/04/2020 12:09:23 PM This report has been signed electronically.

## 2020-01-04 NOTE — Progress Notes (Signed)
Pt tolerated well. VSS. Awake and to recovery. 

## 2020-01-04 NOTE — ED Provider Notes (Signed)
[facility] DEPT Provider Note   CSN: [id number] Arrival date & time: 01/04/20  1323     History Chief Complaint  Patient presents with  . Abdominal Pain    Ivan Brown is a 49 y.o. male.  Presents to the emergency department with chief complaint of left-sided abdominal pain.  Patient reports he has struggled with severe abdominal pain for many months, followed by gastroenterology.  Went to his schedule colonoscopy, which she reports was uncomplicated, was told he had normal results.  After waking up from anesthesia continuing to have severe abdominal pain.  Currently 10 out of 10 sharp, stabbing, left-sided.  No associated vomiting or diarrhea.  Has not taken any medicine yet.  No fever.  HPI     Past Medical History:  Diagnosis Date  . COPD (chronic obstructive pulmonary disease) (HCC)     There are no problems to display for this patient.   Past Surgical History:  Procedure Laterality Date  . COLONOSCOPY  03/09/2011   Dr Lyda Jester- Mild colitis of the splenic and descending colon with no mucosal disruptions, especially no ulcers no erosions and no bleeding was present. The rest of mucosal surface unremarkable. Lesions may have been missed secondary to unprepped bowel  . ESOPHAGOGASTRODUODENOSCOPY  02/21/2014   Ulcers in the antrum (biopsy)   . OTHER SURGICAL HISTORY Right    right arm has plate and screws and shoulder       Family History  Problem Relation Age of Onset  . Colon cancer Father        dx at 51   . Lung cancer Maternal Aunt   . Brain cancer Maternal Aunt   . Esophageal cancer Neg Hx     Social History   Tobacco Use  . Smoking status: Current Every Day Smoker  . Smokeless tobacco: Never Used  Substance Use Topics  . Alcohol use: Yes  . Drug use: Not Currently    Home Medications Prior to Admission medications   Medication Sig Start Date End Date Taking? Authorizing Provider  BREO ELLIPTA 100-25 MCG/INH AEPB 1  puff daily. 11/29/19   [provider]  gabapentin (NEURONTIN) 300 MG capsule Take 600 mg by mouth at bedtime.  11/03/19   [provider]  hydrOXYzine (VISTARIL) 50 MG capsule Take by mouth. 11/30/19   [provider]  LINZESS 290 MCG CAPS capsule Take 580 mcg by mouth daily.  11/03/19   [provider]  ondansetron (ZOFRAN ODT) 4 MG disintegrating tablet Take 1 tablet (4 mg total) by mouth every 8 (eight) hours as needed for nausea or vomiting. 01/04/20   Lucrezia Starch, MD  Prucalopride Succinate (MOTEGRITY) 2 MG TABS Take 1 tablet (2 mg total) by mouth daily. 12/23/19   Jackquline Denmark, MD  Prucalopride Succinate (MOTEGRITY) 2 MG TABS Take 1 tablet (2 mg total) by mouth daily. 12/23/19   Jackquline Denmark, MD  traZODone (DESYREL) 50 MG tablet  12/31/19   [provider]    Allergies    Nsaids and Other  Review of Systems   Review of Systems  Constitutional: Negative for chills and fever.  HENT: Negative for ear pain and sore throat.   Eyes: Negative for pain and visual disturbance.  Respiratory: Negative for cough and shortness of breath.   Cardiovascular: Negative for chest pain and palpitations.  Gastrointestinal: Positive for abdominal pain. Negative for vomiting.  Genitourinary: Negative for dysuria and hematuria.  Musculoskeletal: Negative for arthralgias and back pain.  Skin: Negative for color change and rash.  Neurological: Negative for seizures and syncope.  All other systems reviewed and are negative.   Physical Exam Updated Vital Signs BP 112/78 (BP Location: Left Arm)   Pulse 62   Temp 97.8 F (36.6 C) (Oral)   Resp 16   SpO2 100%   Physical Exam Vitals and nursing note reviewed.  Constitutional:      Appearance: He is well-developed.  HENT:     Head: Normocephalic and atraumatic.  Eyes:     Conjunctiva/sclera: Conjunctivae normal.  Cardiovascular:     Rate and Rhythm: Normal rate and regular rhythm.     Heart  sounds: No murmur.  Pulmonary:     Effort: Pulmonary effort is normal. No respiratory distress.     Breath sounds: Normal breath sounds.  Abdominal:     General: Bowel sounds are normal. There is no distension. There are no signs of injury.     Palpations: Abdomen is soft. There is no shifting dullness or fluid wave.     Tenderness: There is abdominal tenderness in the left upper quadrant and left lower quadrant.  Musculoskeletal:     Cervical back: Neck supple.  Skin:    General: Skin is warm and dry.  Neurological:     Mental Status: He is alert.     ED Results / Procedures / Treatments   Labs (all labs ordered are listed, but only abnormal results are displayed) Labs Reviewed  CBC WITH DIFFERENTIAL/PLATELET - Abnormal; Notable for the following components:      Result Value   RDW 15.6 (*)    All other components within normal limits  BASIC METABOLIC PANEL - Abnormal; Notable for the following components:   BUN 21 (*)    Calcium 8.3 (*)    All other components within normal limits  HEPATIC FUNCTION PANEL - Abnormal; Notable for the following components:   Albumin 3.4 (*)    AST 14 (*)    All other components within normal limits  LIPASE, BLOOD    EKG None  Radiology CT ABDOMEN PELVIS W CONTRAST  Result Date: 01/04/2020 CLINICAL DATA:  Severe LEFT LOWER QUADRANT abdominal pain. Patient had colonoscopy earlier same day. EXAM: CT ABDOMEN AND PELVIS WITH CONTRAST TECHNIQUE: Multidetector CT imaging of the abdomen and pelvis was performed using the standard protocol following bolus administration of intravenous contrast. CONTRAST:  1104mL OMNIPAQUE IOHEXOL 300 MG/ML IV. COMPARISON:  10/01/2017 and earlier. FINDINGS: Lower chest: Emphysematous changes throughout the visualized lung bases as noted previously. Visualized lung bases clear. Heart size normal. Hepatobiliary: Mild diffuse hepatic steatosis without focal hepatic parenchymal abnormality. Gallbladder normal in appearance  without calcified gallstones. No biliary ductal dilation. Pancreas: Normal in appearance without evidence of mass, ductal dilation, or inflammation. Spleen: Numerous benign calcified granulomas throughout the spleen. No significant splenic abnormality. Adrenals/Urinary Tract: Normal appearing adrenal glands. Kidneys normal in size and appearance without focal parenchymal abnormality. No hydronephrosis. No evidence of urinary tract calculi. Normal appearing urinary bladder. Stomach/Bowel: Stomach normal in appearance for the degree of distention. Normal-appearing small bowel with gas in the normal caliber distal ileum related to the colonoscopy earlier and possibly indicating an incompetent ileocecal valve. Gas throughout normal caliber colon as expected after colonoscopy. No focal colonic abnormality. Normal appearing gas-filled appendix in the RIGHT upper pelvis. Vascular/Lymphatic: Moderate aortoiliac atherosclerosis without aneurysm, somewhat advanced for age. No pathologic lymphadenopathy. Reproductive: Prostate gland and seminal vesicles normal in size and appearance for age. Other: No evidence  of free intraperitoneal air. Musculoskeletal: Regional skeleton unremarkable without acute or significant osseous abnormality. IMPRESSION: 1. No acute abnormalities involving the abdomen or pelvis. Specifically, no evidence of free intraperitoneal air to suggest perforation. 2. Mild diffuse hepatic steatosis without focal hepatic parenchymal abnormality. Aortic Atherosclerosis (ICD10-I70.0) and Emphysema (ICD10-J43.9). Electronically Signed   By: Evangeline Dakin M.D.   On: 01/04/2020 15:03    Procedures Procedures (including critical care time)  Medications Ordered in ED Medications  sodium chloride (PF) 0.9 % injection (has no administration in time range)  HYDROmorphone (DILAUDID) injection 1 mg (1 mg Intravenous Given 01/04/20 1409)  iohexol (OMNIPAQUE) 300 MG/ML solution 100 mL (100 mLs Intravenous Contrast  Given 01/04/20 1444)  HYDROmorphone (DILAUDID) injection 1 mg (1 mg Intravenous Given 01/04/20 1606)  ondansetron (ZOFRAN) injection 4 mg (4 mg Intravenous Given 01/04/20 1606)    ED Course  I have reviewed the triage vital signs and the nursing notes.  Pertinent labs & imaging results that were available during my care of the patient were reviewed by me and considered in my medical decision making (see chart for details).    MDM Rules/Calculators/A&P                      49 year old male presents to ER with left-sided abdominal pain after colonoscopy earlier today.  Here patient was noted to be very well-appearing, normal vital signs.  Noted some tenderness on his left side.  CT scan demonstrated no acute findings, no complications from his colonoscopy.  Labs within normal limits.  On reassessment patient remains well-appearing with soft abdomen.  Believe he is appropriate for discharge and outpatient management at this time.  Suspect more likely chronic issue with an acute problem today.  Recommend recheck with GI.    After the discussed management above, the patient was determined to be safe for discharge.  The patient was in agreement with this plan and all questions regarding their care were answered.  ED return precautions were discussed and the patient will return to the ED with any significant worsening of condition.    Final Clinical Impression(s) / ED Diagnoses Final diagnoses:  Abdominal pain, unspecified abdominal location    Rx / DC Orders ED Discharge Orders         Ordered    ondansetron (ZOFRAN ODT) 4 MG disintegrating tablet  Every 8 hours PRN     01/04/20 1620           Lucrezia Starch, MD 01/04/20 1722

## 2020-01-06 ENCOUNTER — Telehealth: Payer: Self-pay | Admitting: *Deleted

## 2020-01-06 ENCOUNTER — Telehealth: Payer: Self-pay | Admitting: Podiatry

## 2020-01-06 NOTE — Telephone Encounter (Signed)
  Follow up Call-  Call back number 01/04/2020  Post procedure Call Back phone  # [phone]  Permission to leave phone message Yes  Some recent data might be hidden     Patient questions:  Do you have a fever, pain , or abdominal swelling? Yes.   Pain Score  7 *  Have you tolerated food without any problems? No.  Have you been able to return to your normal activities? No.  Do you have any questions about your discharge instructions: Diet   No. Medications  No. Follow up visit  No.  Do you have questions or concerns about your Care? No.  Actions: * If pain score is 4 or above:    Dr. Lyndel Safe, Pt states he is still having pain on his left side- states he came in with that pain before the procedure.  He states it started hurting the day before his procedure.  Rates as "6-7".  He states he hasn't been able to eat very much at all and doesn't have any energy.  He is nauseated.  Please advise, Cyril Mourning

## 2020-01-06 NOTE — Telephone Encounter (Signed)
I need to get a work status note for Ivan Brown. I also have a question in regards to mention of surgery. Please call me back at [phone].

## 2020-01-07 NOTE — Telephone Encounter (Signed)
LMOM re: Dr. Steve Rattler recommendations

## 2020-01-07 NOTE — Telephone Encounter (Signed)
Thank you for letting me know Had negative CT Abdo/pelvis the day of colonoscopy. If still with problems, I will arrange him to come see me in the office.  RG

## 2020-01-08 ENCOUNTER — Encounter: Payer: Self-pay | Admitting: Gastroenterology

## 2020-01-10 NOTE — Telephone Encounter (Signed)
Left voicemail letting Nunzio Cory know I have not forgotten about her and that I would send her the letter tomorrow and call her back or she could call me back regarding the question she has about sx.

## 2020-01-12 ENCOUNTER — Other Ambulatory Visit: Payer: Self-pay | Admitting: Podiatry

## 2020-01-12 DIAGNOSIS — S92352A Displaced fracture of fifth metatarsal bone, left foot, initial encounter for closed fracture: Secondary | ICD-10-CM

## 2020-01-12 DIAGNOSIS — Z9889 Other specified postprocedural states: Secondary | ICD-10-CM

## 2020-01-17 ENCOUNTER — Other Ambulatory Visit: Payer: Self-pay | Admitting: Podiatry

## 2020-01-17 ENCOUNTER — Ambulatory Visit (INDEPENDENT_AMBULATORY_CARE_PROVIDER_SITE_OTHER): Payer: BLUE CROSS/BLUE SHIELD

## 2020-01-17 ENCOUNTER — Ambulatory Visit (INDEPENDENT_AMBULATORY_CARE_PROVIDER_SITE_OTHER): Admitting: Podiatry

## 2020-01-17 ENCOUNTER — Other Ambulatory Visit: Payer: Self-pay

## 2020-01-17 DIAGNOSIS — S92355D Nondisplaced fracture of fifth metatarsal bone, left foot, subsequent encounter for fracture with routine healing: Secondary | ICD-10-CM | POA: Diagnosis not present

## 2020-01-17 DIAGNOSIS — M79672 Pain in left foot: Secondary | ICD-10-CM

## 2020-01-17 DIAGNOSIS — Z9889 Other specified postprocedural states: Secondary | ICD-10-CM

## 2020-01-17 NOTE — Progress Notes (Signed)
  Subjective:  Patient ID: Ivan Brown, male    DOB: Jan 19, 1971,  MRN: [id number]  Chief Complaint  Patient presents with  . Foot Injury    F/U Lt 5th fx Pt. states,' really sore, I can't tell if it's feeling any better. After 10 mins. of walking it hurts up till thenext day; 7/10." Tx: cam boot -pt states he tried wearing reg. shoes 1 time an dit hurt worse    49 y.o. male presents with the above complaint. History confirmed with patient. DOI: 11/22/2019  Objective:  Physical Exam: warm, good capillary refill, no trophic changes or ulcerative lesions, normal DP and PT pulses and normal sensory exam. Left Foot: tenderness 5th met base   Radiographs: X-ray of the right foot: fracture of left 5th metatarsal with evident fracture line but signs of healing, good alignment   Assessment:   1. Post-operative state   2. Closed nondisplaced fracture of fifth metatarsal bone of left foot with routine healing, subsequent encounter     Plan:  Patient was evaluated and treated and all questions answered.  Fracture of Lt 5th metatarsal -New XR show still evident fracture line, patient still with pain -Continue CAM boot for 2 more weeks. -New XR in 2 weeks, plan for transition to ankle brace and sneaker if pain improved  Return in about 2 weeks (around 01/31/2020) for Fracture f/u , with XRs.

## 2020-01-18 ENCOUNTER — Other Ambulatory Visit: Payer: Self-pay | Admitting: Podiatry

## 2020-01-18 DIAGNOSIS — S92355D Nondisplaced fracture of fifth metatarsal bone, left foot, subsequent encounter for fracture with routine healing: Secondary | ICD-10-CM

## 2020-01-20 ENCOUNTER — Telehealth: Payer: Self-pay | Admitting: Podiatry

## 2020-01-20 NOTE — Telephone Encounter (Signed)
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 I'm calling to get the ov note and work status note from 01/17/20. Pt states he was released to go back to work on 01/31/20, however, I don't have the work status note saying he can go back to work. If you could fax that to me at [phone]. If you have any questions, my phone number is [phone]. I will also fax a written request. Thank you.

## 2020-01-21 ENCOUNTER — Encounter: Payer: Self-pay | Admitting: Podiatry

## 2020-01-31 ENCOUNTER — Other Ambulatory Visit: Payer: Self-pay | Admitting: Podiatry

## 2020-01-31 ENCOUNTER — Encounter: Payer: Self-pay | Admitting: Podiatry

## 2020-01-31 ENCOUNTER — Other Ambulatory Visit: Payer: Self-pay

## 2020-01-31 ENCOUNTER — Ambulatory Visit (INDEPENDENT_AMBULATORY_CARE_PROVIDER_SITE_OTHER): Admitting: Podiatry

## 2020-01-31 ENCOUNTER — Ambulatory Visit (INDEPENDENT_AMBULATORY_CARE_PROVIDER_SITE_OTHER): Payer: BLUE CROSS/BLUE SHIELD

## 2020-01-31 DIAGNOSIS — S92355D Nondisplaced fracture of fifth metatarsal bone, left foot, subsequent encounter for fracture with routine healing: Secondary | ICD-10-CM

## 2020-01-31 DIAGNOSIS — Z9889 Other specified postprocedural states: Secondary | ICD-10-CM

## 2020-01-31 DIAGNOSIS — M79672 Pain in left foot: Secondary | ICD-10-CM

## 2020-01-31 NOTE — Progress Notes (Signed)
  Subjective:  Patient ID: Ivan Brown, male    DOB: 1971/06/10,  MRN: [id number]  Chief Complaint  Patient presents with  . Fracture    F/U Lt 5th fx Pt. states," it's a lot better. It doesn't bother me none. It's just tender." -pt denies swellgin/redness Tx: none -pt d/c cam boot 3-4 days ago     49 y.o. male presents with the above complaint. History confirmed with patient. DOI: 11/22/2019  Objective:  Physical Exam: warm, good capillary refill, no trophic changes or ulcerative lesions, normal DP and PT pulses and normal sensory exam. Left Foot: tenderness 5th met base   Radiographs: X-ray of the right foot: fracture of left 5th metatarsal with consolidation, good alignment   Assessment:   1. Post-operative state   2. Closed nondisplaced fracture of fifth metatarsal bone of left foot with routine healing, subsequent encounter     Plan:  Patient was evaluated and treated and all questions answered.  Fracture of Lt 5th metatarsal -New XR shows consolidation of the fracture. -Still with some tenderness but no pain with activity. Is in his normal shoegear without pain. -WBAT in normal shoegear -F/u in 1 month for recheck -Note for return to work without restrictions.  No follow-ups on file.

## 2020-02-01 ENCOUNTER — Telehealth: Payer: Self-pay | Admitting: Podiatry

## 2020-02-01 NOTE — Telephone Encounter (Signed)
I'm calling to get the ov note from 02/08 for Mr. Whittum workers compensation claim. I will fax the request to the fax number you provided. My fax number is [phone]. Thank you.

## 2020-02-02 ENCOUNTER — Other Ambulatory Visit: Payer: Self-pay | Admitting: Podiatry

## 2020-02-02 DIAGNOSIS — S92355D Nondisplaced fracture of fifth metatarsal bone, left foot, subsequent encounter for fracture with routine healing: Secondary | ICD-10-CM

## 2020-02-02 DIAGNOSIS — Z9889 Other specified postprocedural states: Secondary | ICD-10-CM

## 2020-02-09 ENCOUNTER — Telehealth: Payer: Self-pay

## 2020-02-09 ENCOUNTER — Other Ambulatory Visit: Payer: Self-pay | Admitting: Podiatry

## 2020-02-09 MED ORDER — TRAMADOL HCL 50 MG PO TABS: 50.0000 mg | ORAL_TABLET | Freq: Three times a day (TID) | ORAL | 0 refills | Status: AC | PRN

## 2020-02-09 NOTE — Telephone Encounter (Signed)
Pt called stating he has been having an increase of pain at "the tendon around my ankle" since yesterday. Pt denies any injury. Pt states pain has been very bad that it kept him  Up all night (8/10 sharp throbbing pain). Pt. States Dr. March Rummage knows about that pain around his ankle. Please advice  Pharmacy: [facility] in [place] at [address]

## 2020-02-28 ENCOUNTER — Ambulatory Visit (INDEPENDENT_AMBULATORY_CARE_PROVIDER_SITE_OTHER): Admitting: Podiatry

## 2020-02-28 ENCOUNTER — Other Ambulatory Visit: Payer: Self-pay

## 2020-02-28 ENCOUNTER — Telehealth: Payer: Self-pay | Admitting: *Deleted

## 2020-02-28 ENCOUNTER — Other Ambulatory Visit: Payer: Self-pay | Admitting: Podiatry

## 2020-02-28 ENCOUNTER — Ambulatory Visit (INDEPENDENT_AMBULATORY_CARE_PROVIDER_SITE_OTHER): Payer: BLUE CROSS/BLUE SHIELD

## 2020-02-28 DIAGNOSIS — S92355D Nondisplaced fracture of fifth metatarsal bone, left foot, subsequent encounter for fracture with routine healing: Secondary | ICD-10-CM

## 2020-02-28 DIAGNOSIS — S92902K Unspecified fracture of left foot, subsequent encounter for fracture with nonunion: Secondary | ICD-10-CM | POA: Diagnosis not present

## 2020-02-28 DIAGNOSIS — G8929 Other chronic pain: Secondary | ICD-10-CM

## 2020-02-28 DIAGNOSIS — M79672 Pain in left foot: Secondary | ICD-10-CM

## 2020-02-28 DIAGNOSIS — M7672 Peroneal tendinitis, left leg: Secondary | ICD-10-CM

## 2020-02-28 NOTE — Telephone Encounter (Signed)
-----   Message from Evelina Bucy, DPM sent at 02/28/2020  8:15 AM EST ----- Can we order MRI at [facility]? R/o peroneal tendon tear and non-union of metatarsal fracture

## 2020-02-28 NOTE — Progress Notes (Signed)
  Subjective:  Patient ID: Ivan Brown, male    DOB: 1971-01-22,  MRN: [id number]  Chief Complaint  Patient presents with  . Fracture    F/U Lt 5th fx Pt. states," it don't bother me until 1-2 hrs at work, but more towards the outside of my anke, it throbbs ; 6-7/10 pain. My fx place is just very sore." Tx; icing -pt dc tramadol not helping and causing breathing issues     49 y.o. male presents with the above complaint. History confirmed with patient.   Objective:  Physical Exam: warm, good capillary refill, no trophic changes or ulcerative lesions, normal DP and PT pulses and normal sensory exam. Left Foot: continued pain at 5th metatarsal base, pain along peroneal tendons retromalleolar and extending distally, decreased peroneal strength   Radiographs: X-ray of the right foot: fracture of the 5th metatarsal base does appear consolidated   Assessment:   1. Closed nondisplaced fracture of fifth metatarsal bone of left foot with routine healing, subsequent encounter   2. Peroneal tendonitis, left   3. Chronic foot pain, left    Plan:  Patient was evaluated and treated and all questions answered.  Fracture 5th Metatarsal base, sequela; peroneal tendonitis -XR reviewed with patient -Dispense trilock brace for immobilization of the ankle -Would benefit from MRI to r/o non-union or peroneal tear. Will order -F/u for 2 weeks for review of MRI.  No follow-ups on file.

## 2020-02-28 NOTE — Telephone Encounter (Signed)
Faxed orders to Dr. Catha Gosselin assistant for pre-cert.

## 2020-03-01 ENCOUNTER — Telehealth: Payer: Self-pay

## 2020-03-01 NOTE — Telephone Encounter (Signed)
Authorization for MRI of Left foot was not required.  Spoke to [place] at [place] and got pt scheduled for March 15th, pt to be there at 4pm for a 4:30 pm appt.  MRI order form has been faxed to [place] and pt has been informed of appt for MRI

## 2020-03-01 NOTE — Telephone Encounter (Signed)
Authorization for MRI of Left foot was not required.  Spoke to [place] at [place] and got pt scheduled for March 15th, pt to be there at 4pm for a 4:30 pm appt.  MRI order form has been faxed to [facility] and pt has been informed of appt for MRI

## 2020-03-03 ENCOUNTER — Telehealth: Payer: Self-pay | Admitting: *Deleted

## 2020-03-03 MED ORDER — OXYCODONE-ACETAMINOPHEN 5-325 MG PO TABS: 1.0000 | ORAL_TABLET | ORAL | 0 refills | Status: DC | PRN

## 2020-03-03 NOTE — Telephone Encounter (Signed)
Dr. March Rummage states he received a on-call doctor request from pt and requested I check on pt.

## 2020-03-03 NOTE — Telephone Encounter (Signed)
I spoke with pt and he states 02/29/2020 he went back to work and fell of the last couple of steps on the ladder and the foot is killing him. I told pt that I would send his request to Dr. March Rummage for pain medication and transfer him to the scheduler to be scheduled for an appt early next week.

## 2020-03-03 NOTE — Telephone Encounter (Signed)
I informed pt of Dr. Eleanora Neighbor orders and pt asked if he needed to keep the 8:15am appt 03/06/2020 and wear the boot and I told him yes.

## 2020-03-06 ENCOUNTER — Telehealth: Payer: Self-pay

## 2020-03-06 ENCOUNTER — Other Ambulatory Visit: Payer: Self-pay

## 2020-03-06 ENCOUNTER — Ambulatory Visit (INDEPENDENT_AMBULATORY_CARE_PROVIDER_SITE_OTHER): Admitting: Podiatry

## 2020-03-06 DIAGNOSIS — R600 Localized edema: Secondary | ICD-10-CM

## 2020-03-06 DIAGNOSIS — S92902K Unspecified fracture of left foot, subsequent encounter for fracture with nonunion: Secondary | ICD-10-CM

## 2020-03-06 MED ORDER — OXYCODONE-ACETAMINOPHEN 5-325 MG PO TABS: 1.0000 | ORAL_TABLET | ORAL | 0 refills | Status: DC | PRN

## 2020-03-06 NOTE — Addendum Note (Signed)
Addended by: Hardie Pulley on: 03/06/2020 08:23 AM   Modules accepted: Orders

## 2020-03-06 NOTE — Progress Notes (Signed)
  Subjective:  Patient ID: Ivan Brown, male    DOB: September 20, 1971,  MRN: [id number]  Chief Complaint  Patient presents with  . Foot Injury    F/U Lt foot fx Pt. states there has been an increase in pain at his fx site and radiates to back of the heel.; 8/10 sharp constatn pain." -w/ swellgin tx: boot, trilock and resting and icing     49 y.o. male presents with the above complaint. History confirmed with patient.   Objective:  Physical Exam: warm, good capillary refill, no trophic changes or ulcerative lesions, normal DP and PT pulses and normal sensory exam. Left Foot: continued pain at 5th metatarsal base, pain along peroneal tendons retromalleolar and extending distally, decreased peroneal strength   Assessment:   1. 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 Localized edema    Plan:  Patient was evaluated and treated and all questions answered.  Fracture 5th Metatarsal base, sequela; peroneal tendonitis -MRI pending auth -Refill pain medication today -F/u after MRI -Unna boot applied for reduction of edema and immobilization  No follow-ups on file.

## 2020-03-06 NOTE — Telephone Encounter (Signed)
Spoke with case manager nurse [name] requesting verification of an MRI for Mr. Ivan Brown.  Faxed pt's LOV notes, demographics, and MRI order to case manager for review to decide if MRI is needed.  Fax number: [phone]

## 2020-03-10 ENCOUNTER — Telehealth: Payer: Self-pay | Admitting: *Deleted

## 2020-03-10 NOTE — Telephone Encounter (Signed)
[employer] - Ivan Brown states she has authorized L. foot MRI and is sending to [facility] for scheduling refer to Claim:  [id number].

## 2020-03-10 NOTE — Telephone Encounter (Signed)
Thank you Marcy Siren, they will call the pt to get him schedule or do I need to do that?

## 2020-03-13 ENCOUNTER — Ambulatory Visit (INDEPENDENT_AMBULATORY_CARE_PROVIDER_SITE_OTHER): Payer: BLUE CROSS/BLUE SHIELD | Admitting: Podiatry

## 2020-03-13 DIAGNOSIS — Z5329 Procedure and treatment not carried out because of patient's decision for other reasons: Secondary | ICD-10-CM

## 2020-03-15 ENCOUNTER — Other Ambulatory Visit: Payer: Self-pay | Admitting: Podiatry

## 2020-03-15 DIAGNOSIS — S92902K Unspecified fracture of left foot, subsequent encounter for fracture with nonunion: Secondary | ICD-10-CM

## 2020-03-27 ENCOUNTER — Other Ambulatory Visit: Payer: Self-pay

## 2020-03-27 ENCOUNTER — Ambulatory Visit (INDEPENDENT_AMBULATORY_CARE_PROVIDER_SITE_OTHER): Payer: BLUE CROSS/BLUE SHIELD | Admitting: Podiatry

## 2020-03-27 ENCOUNTER — Encounter: Payer: Self-pay | Admitting: Podiatry

## 2020-03-27 DIAGNOSIS — S92902K Unspecified fracture of left foot, subsequent encounter for fracture with nonunion: Secondary | ICD-10-CM

## 2020-03-27 DIAGNOSIS — R600 Localized edema: Secondary | ICD-10-CM | POA: Diagnosis not present

## 2020-03-28 ENCOUNTER — Telehealth: Payer: Self-pay | Admitting: *Deleted

## 2020-03-28 ENCOUNTER — Telehealth: Payer: Self-pay

## 2020-03-28 MED ORDER — OXYCODONE-ACETAMINOPHEN 5-325 MG PO TABS: 1.0000 | ORAL_TABLET | ORAL | 0 refills | Status: DC | PRN

## 2020-03-28 NOTE — Telephone Encounter (Signed)
Pt called to say that he was following up to see if medication was sent to his pharmacy

## 2020-03-28 NOTE — Addendum Note (Signed)
Addended by: Hardie Pulley on: 03/28/2020 12:18 PM   Modules accepted: Orders

## 2020-03-28 NOTE — Telephone Encounter (Signed)
-----   Message from Evelina Bucy, DPM sent at 03/27/2020  3:47 PM EDT ----- Can we order bone stim? 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 Gapping of 76mm

## 2020-03-28 NOTE — Telephone Encounter (Signed)
Pt called stating he was suppose to get a rx sent to his pharmacy but it was never called in. Pt states if you can sent the Rx to [facility] on [address]. And pt also mentioned that he was going to get "somehting electronic for his foot pain."

## 2020-03-28 NOTE — Telephone Encounter (Signed)
I informed [facility] of dr. Eleanora Neighbor orders and prepared demographics, available clinicals and prescription for [facility] Bone Growth Stimulator, to be picked up on Thursday when 03/27/2020 clinicals are available.

## 2020-04-03 ENCOUNTER — Encounter: Payer: Self-pay | Admitting: Podiatry

## 2020-04-03 ENCOUNTER — Telehealth: Payer: Self-pay | Admitting: Podiatry

## 2020-04-03 NOTE — Telephone Encounter (Signed)
Pt called to request to speak to ivonne please call pt

## 2020-04-04 ENCOUNTER — Other Ambulatory Visit: Payer: Self-pay | Admitting: Podiatry

## 2020-04-04 MED ORDER — OXYCODONE-ACETAMINOPHEN 5-325 MG PO TABS: 1.0000 | ORAL_TABLET | ORAL | 0 refills | Status: DC | PRN

## 2020-04-04 NOTE — Telephone Encounter (Signed)
I informed [facility] clinicals of 03/27/2020 were available.

## 2020-04-04 NOTE — Progress Notes (Signed)
Rx sent to pharmacy for outpatient surgery. °

## 2020-04-04 NOTE — Progress Notes (Signed)
  Subjective:  Patient ID: Ivan Brown, male    DOB: 09/08/1971,  MRN: [id number]  No chief complaint on file.   49 y.o. male presents with the above complaint. Still having pain and inability to sleep. Cannot work due to the pain.  Objective:  Physical Exam: warm, good capillary refill, no trophic changes or ulcerative lesions, normal DP and PT pulses and normal sensory exam. Left Foot: continued pain at 5th metatarsal base, pain along peroneal tendons retromalleolar and extending distally, decreased peroneal strength   Assessment:   1. 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 Localized edema    Plan:  Patient was evaluated and treated and all questions answered.  Fracture 5th Metatarsal base, sequela; peroneal tendonitis -MRI reviewed. Non-union noted. -Order bone stimulator given non-union with ~3 mm gapping. -F/u in 3 weeks for recheck.  Return in about 3 weeks (around 04/17/2020).

## 2020-04-04 NOTE — Telephone Encounter (Signed)
See clinicals

## 2020-04-05 NOTE — Telephone Encounter (Signed)
Spoke with Pt., pt states he is still concern of the severity of his pain, pt states he was told by Dr. March Rummage that he would be getting a bone stimulator but has not received it yet and wanted to see if we could get it for him before he leaves out of town. Pt was advised that the [place] for the bone stim will be contacting him and that on meanwhile Dr. March Rummage sent him a RF on oxycodone.

## 2020-04-06 ENCOUNTER — Telehealth: Payer: Self-pay

## 2020-04-06 NOTE — Telephone Encounter (Signed)
Received a voicemail message from Ivan Brown @ [place]? The bone stimulator has been approved. [facility] with be contacting the patient to dispense    Thanks

## 2020-04-06 NOTE — Telephone Encounter (Signed)
Pt informed of [facility] approval and that [facility] will be contacting her for dispensing

## 2020-04-11 ENCOUNTER — Telehealth: Payer: Self-pay | Admitting: *Deleted

## 2020-04-11 ENCOUNTER — Telehealth: Payer: Self-pay

## 2020-04-11 MED ORDER — OXYCODONE-ACETAMINOPHEN 5-325 MG PO TABS: 1.0000 | ORAL_TABLET | ORAL | 0 refills | Status: DC | PRN

## 2020-04-11 NOTE — Telephone Encounter (Signed)
Pt is requesting a RF on his pain medication sent to his pharmacy at [facility] on [address]

## 2020-04-11 NOTE — Telephone Encounter (Signed)
Ivan Brown from [facility] called stating/requesting a copy of the order for the pt's bone stim. Ivan Brown also asked what is the preferred brand that the Ivan Brown. I informed Ivan Brown that Ivan Brown our RN from [place] [facility] had already faxed the order this morning at [place], [place] states if Ivan Brown can refax the order to her at [phone] or send it to her direct e-mail at [email or web address]

## 2020-04-11 NOTE — Telephone Encounter (Signed)
Pt called stating that the rep from the bone stim called him and stated that the bone stim was not approved. I informed to the pt that the process is still being worked on by our [employer] in [place]. Pt states he would like a call from our RN stating what's going on since he is still experiencing sever pain and needs something resolved.

## 2020-04-11 NOTE — Telephone Encounter (Signed)
04/10/2020-[facility] - Katrina request preferred Bone Growth Stimulator brand and representative faxed. 04/11/2020-Faxed [facility] [phone].

## 2020-04-11 NOTE — Telephone Encounter (Signed)
Pt informed of Rx sent to pharmacy

## 2020-04-11 NOTE — Telephone Encounter (Signed)
I informed pt I had received a call from [facility] - Katrina requesting bone growth stimulator brand and the representative contact information to be faxed to her, and I had faxed the information today 8:41am. I told pt he could contact Katrina [phone] to discuss.

## 2020-04-11 NOTE — Telephone Encounter (Signed)
Emailed requested information to [facility] - Katrina after confirmation had been received from her fax this morning previously.

## 2020-04-11 NOTE — Addendum Note (Signed)
Addended by: Hardie Pulley on: 04/11/2020 04:31 PM   Modules accepted: Orders

## 2020-04-17 ENCOUNTER — Ambulatory Visit: Payer: BLUE CROSS/BLUE SHIELD | Admitting: Podiatry

## 2020-04-24 ENCOUNTER — Other Ambulatory Visit: Payer: Self-pay

## 2020-04-24 ENCOUNTER — Ambulatory Visit (INDEPENDENT_AMBULATORY_CARE_PROVIDER_SITE_OTHER): Payer: BLUE CROSS/BLUE SHIELD | Admitting: Podiatry

## 2020-04-24 DIAGNOSIS — S92902K Unspecified fracture of left foot, subsequent encounter for fracture with nonunion: Secondary | ICD-10-CM

## 2020-04-24 MED ORDER — OXYCODONE-ACETAMINOPHEN 5-325 MG PO TABS: 1.0000 | ORAL_TABLET | ORAL | 0 refills | Status: DC | PRN

## 2020-05-01 ENCOUNTER — Telehealth: Payer: Self-pay

## 2020-05-01 MED ORDER — OXYCODONE-ACETAMINOPHEN 10-325 MG PO TABS: 1.0000 | ORAL_TABLET | ORAL | 0 refills | Status: DC | PRN

## 2020-05-01 NOTE — Telephone Encounter (Signed)
Upped the oxycodone to 10mg .

## 2020-05-01 NOTE — Telephone Encounter (Signed)
Pt called stating he needs to talk to Dr. March Rummage since, he is still having severe foot pain with no improvement.

## 2020-05-01 NOTE — Addendum Note (Signed)
Addended by: Hardie Pulley on: 05/01/2020 12:00 PM   Modules accepted: Orders

## 2020-05-08 ENCOUNTER — Other Ambulatory Visit: Payer: Self-pay | Admitting: Podiatry

## 2020-05-08 MED ORDER — OXYCODONE-ACETAMINOPHEN 10-325 MG PO TABS: 1.0000 | ORAL_TABLET | ORAL | 0 refills | Status: DC | PRN

## 2020-05-08 NOTE — Progress Notes (Signed)
Rx sent to pharmacy   

## 2020-05-09 ENCOUNTER — Ambulatory Visit (INDEPENDENT_AMBULATORY_CARE_PROVIDER_SITE_OTHER): Payer: BLUE CROSS/BLUE SHIELD

## 2020-05-09 ENCOUNTER — Other Ambulatory Visit: Payer: Self-pay

## 2020-05-09 ENCOUNTER — Other Ambulatory Visit: Payer: Self-pay | Admitting: Podiatry

## 2020-05-09 ENCOUNTER — Ambulatory Visit (INDEPENDENT_AMBULATORY_CARE_PROVIDER_SITE_OTHER): Payer: BLUE CROSS/BLUE SHIELD | Admitting: Podiatry

## 2020-05-09 DIAGNOSIS — M79672 Pain in left foot: Secondary | ICD-10-CM

## 2020-05-09 DIAGNOSIS — S92902K Unspecified fracture of left foot, subsequent encounter for fracture with nonunion: Secondary | ICD-10-CM

## 2020-05-11 ENCOUNTER — Telehealth: Payer: Self-pay

## 2020-05-11 MED ORDER — OXYCODONE-ACETAMINOPHEN 10-325 MG PO TABS: 1.0000 | ORAL_TABLET | ORAL | 0 refills | Status: DC | PRN

## 2020-05-11 NOTE — Addendum Note (Signed)
Addended by: Hardie Pulley on: 05/11/2020 02:10 PM   Modules accepted: Orders

## 2020-05-11 NOTE — Telephone Encounter (Signed)
Pt called requesting a RF on pain medication  

## 2020-05-17 ENCOUNTER — Telehealth: Payer: Self-pay

## 2020-05-17 MED ORDER — OXYCODONE-ACETAMINOPHEN 10-325 MG PO TABS: 1.0000 | ORAL_TABLET | ORAL | 0 refills | Status: DC | PRN

## 2020-05-17 NOTE — Telephone Encounter (Signed)
Pt informed of RF sent to pharmacy

## 2020-05-17 NOTE — Addendum Note (Signed)
Addended by: Hardie Pulley on: 05/17/2020 10:02 AM   Modules accepted: Orders

## 2020-05-17 NOTE — Telephone Encounter (Signed)
Pt called requesting a RF on pain medication. Dr. March Rummage please advice

## 2020-05-23 ENCOUNTER — Telehealth: Payer: Self-pay

## 2020-05-23 NOTE — Telephone Encounter (Signed)
Pt called requesting a RF on pain medication  

## 2020-05-25 ENCOUNTER — Telehealth: Payer: Self-pay

## 2020-05-25 MED ORDER — OXYCODONE-ACETAMINOPHEN 10-325 MG PO TABS: 1.0000 | ORAL_TABLET | ORAL | 0 refills | Status: DC | PRN

## 2020-05-25 NOTE — Addendum Note (Signed)
Addended by: Hardie Pulley on: 05/25/2020 02:52 PM   Modules accepted: Orders

## 2020-05-25 NOTE — Telephone Encounter (Signed)
Pt called requesting a RF on pain medication

## 2020-05-30 ENCOUNTER — Other Ambulatory Visit: Payer: Self-pay | Admitting: Podiatry

## 2020-05-30 ENCOUNTER — Ambulatory Visit: Payer: BLUE CROSS/BLUE SHIELD | Admitting: Podiatry

## 2020-05-30 MED ORDER — OXYCODONE-ACETAMINOPHEN 10-325 MG PO TABS: 1.0000 | ORAL_TABLET | ORAL | 0 refills | Status: DC | PRN

## 2020-05-30 NOTE — Progress Notes (Signed)
Pain Rx refilled.

## 2020-06-06 ENCOUNTER — Other Ambulatory Visit: Payer: Self-pay

## 2020-06-06 ENCOUNTER — Telehealth: Payer: Self-pay

## 2020-06-06 ENCOUNTER — Encounter: Payer: Self-pay | Admitting: Podiatry

## 2020-06-06 ENCOUNTER — Ambulatory Visit (INDEPENDENT_AMBULATORY_CARE_PROVIDER_SITE_OTHER): Payer: BLUE CROSS/BLUE SHIELD | Admitting: Podiatry

## 2020-06-06 DIAGNOSIS — S92902K Unspecified fracture of left foot, subsequent encounter for fracture with nonunion: Secondary | ICD-10-CM

## 2020-06-06 MED ORDER — OXYCODONE-ACETAMINOPHEN 5-325 MG PO TABS: 1.0000 | ORAL_TABLET | ORAL | 0 refills | Status: DC | PRN

## 2020-06-06 NOTE — Telephone Encounter (Signed)
Pt called rquesting a Rf on pain medication

## 2020-06-08 ENCOUNTER — Telehealth: Payer: Self-pay | Admitting: Podiatry

## 2020-06-08 NOTE — Telephone Encounter (Signed)
Good Morning, Dr. March Rummage. I need office notes for visits dated 04/24/2020 and 05/09/2020 for pts Disability.   Thank you

## 2020-06-11 NOTE — Progress Notes (Signed)
  Subjective:  Patient ID: Ivan Brown, male    DOB: 1971/06/03,  MRN: [id number]  Chief Complaint  Patient presents with  . Foot Injury    F/U Lt clsed fx Pt. c/o of pain at Lt 4th-5th mets with increae of pain tht runs up the knee x 4 days; 8/10 sharp-stabbing pain -was swollen -wrose durring the day Tx: icing, boot, elevatoin, and bone stim (not helping)     49 y.o. male presents with the above complaint. Still having severe pain.  Objective:  Physical Exam: warm, good capillary refill, no trophic changes or ulcerative lesions, normal DP and PT pulses and normal sensory exam. Left Foot: continued pain at 5th metatarsal base, pain along peroneal tendons retromalleolar and extending distally, decreased peroneal strength   Assessment:   1. 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  Fracture 5th Metatarsal base, sequela; peroneal tendonitis -Still having pain -Refill pain Rx -At this point continue Bone stimulator. Repeat XR in 2 weeks. -Start PT. -Should pain persist would consider partial excision of bone fragment.   No follow-ups on file.

## 2020-06-12 ENCOUNTER — Other Ambulatory Visit: Payer: Self-pay | Admitting: Podiatry

## 2020-06-12 MED ORDER — OXYCODONE-ACETAMINOPHEN 5-325 MG PO TABS: 1.0000 | ORAL_TABLET | ORAL | 0 refills | Status: DC | PRN

## 2020-06-12 NOTE — Progress Notes (Signed)
Refill of pain medication sent to pharmacy.

## 2020-06-19 ENCOUNTER — Other Ambulatory Visit: Payer: Self-pay | Admitting: Podiatry

## 2020-06-19 ENCOUNTER — Ambulatory Visit: Payer: BLUE CROSS/BLUE SHIELD | Admitting: Podiatry

## 2020-06-19 ENCOUNTER — Telehealth: Payer: Self-pay

## 2020-06-19 DIAGNOSIS — M79672 Pain in left foot: Secondary | ICD-10-CM

## 2020-06-19 MED ORDER — OXYCODONE-ACETAMINOPHEN 5-325 MG PO TABS: 1.0000 | ORAL_TABLET | ORAL | 0 refills | Status: DC | PRN

## 2020-06-19 NOTE — Telephone Encounter (Signed)
Pt was informed of RF Rx sent to his pharmacy

## 2020-06-19 NOTE — Telephone Encounter (Signed)
Pt called requesting a RF on pain medication. Please advice

## 2020-06-19 NOTE — Progress Notes (Signed)
Refill approved.

## 2020-06-19 NOTE — Telephone Encounter (Signed)
Approved please inform

## 2020-06-23 ENCOUNTER — Telehealth: Payer: Self-pay

## 2020-06-23 MED ORDER — OXYCODONE-ACETAMINOPHEN 5-325 MG PO TABS: 1.0000 | ORAL_TABLET | ORAL | 0 refills | Status: DC | PRN

## 2020-06-23 NOTE — Addendum Note (Signed)
Addended by: Hardie Pulley on: 06/23/2020 05:44 PM   Modules accepted: Orders

## 2020-06-23 NOTE — Telephone Encounter (Signed)
Pt notified of Rx sent to pharmacy. Pt states wife has passed away and would be traveling to [facility] for the funeral

## 2020-06-23 NOTE — Telephone Encounter (Signed)
Pt called requesting a RF on pain medication. Pt staets if it can be called in today since he is going out of town

## 2020-06-27 NOTE — Progress Notes (Signed)
  Subjective:  Patient ID: Ivan Brown, male    DOB: 1971/09/25,  MRN: [id number]  Chief Complaint  Patient presents with  . Fracture    F/U Lt fx Pt. states," it's hurting at my ankle and bottom of my foot; 6/10 shapr pains.' -stim not  helping tx: bone stim, crutches and pain meds -w/ swellgin     49 y.o. male presents with the above complaint. Hx as above.  Objective:  Physical Exam: warm, good capillary refill, no trophic changes or ulcerative lesions, normal DP and PT pulses and normal sensory exam. Left Foot: Continued pain at the 5th met base, tender to palpation, pain along the peroneal tendons.  Assessment:   1. 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  Fracture 5th Metatarsal base, sequela; peroneal tendonitis -Discussed continued use of bone stimulator. -Encouraged smoking cessation.  No follow-ups on file.

## 2020-06-27 NOTE — Progress Notes (Signed)
  Subjective:  Patient ID: Ivan Brown, male    DOB: 02-Nov-1971,  MRN: [id number]  Chief Complaint  Patient presents with  . Fracture    F/U Lt fx Pt. states," used the bone stim last 10 days and it still hurst the same; 8/10 sharp pains at plantar and dorsal foot." -w/ throbbign and swellgin tx: bone stim, crutches, BC ,, and pain meds    49 y.o. male presents with the above complaint. Hx as above.  Objective:  Physical Exam: warm, good capillary refill, no trophic changes or ulcerative lesions, normal DP and PT pulses and normal sensory exam. Left Foot: continued pain at 5th metatarsal base, pain along peroneal tendons retromalleolar and extending distally, decreased peroneal strength   Assessment:   1. 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  Fracture 5th Metatarsal base, sequela; peroneal tendonitis -XR taken, does shoe consolidation on XR but still with extreme pain. -Continue NWB with crutches. -Refill pain Rx.  Return in about 2 weeks (around 05/08/2020) for Fracture f/u with XRs.

## 2020-07-03 ENCOUNTER — Telehealth: Payer: Self-pay | Admitting: Podiatry

## 2020-07-03 ENCOUNTER — Other Ambulatory Visit: Payer: Self-pay | Admitting: Podiatry

## 2020-07-03 ENCOUNTER — Ambulatory Visit (INDEPENDENT_AMBULATORY_CARE_PROVIDER_SITE_OTHER): Payer: BLUE CROSS/BLUE SHIELD | Admitting: Podiatry

## 2020-07-03 DIAGNOSIS — Z5329 Procedure and treatment not carried out because of patient's decision for other reasons: Secondary | ICD-10-CM

## 2020-07-03 DIAGNOSIS — M79672 Pain in left foot: Secondary | ICD-10-CM

## 2020-07-03 MED ORDER — OXYCODONE-ACETAMINOPHEN 5-325 MG PO TABS: 1.0000 | ORAL_TABLET | Freq: Three times a day (TID) | ORAL | 0 refills | Status: DC | PRN

## 2020-07-03 NOTE — Telephone Encounter (Signed)
Patient called for refill of his pain medication today. Called into pharmacy. Changed to q8

## 2020-07-03 NOTE — Progress Notes (Signed)
No show for appt. 

## 2020-07-11 ENCOUNTER — Other Ambulatory Visit: Payer: Self-pay | Admitting: Podiatry

## 2020-07-11 MED ORDER — OXYCODONE-ACETAMINOPHEN 5-325 MG PO TABS: 1.0000 | ORAL_TABLET | Freq: Three times a day (TID) | ORAL | 0 refills | Status: DC | PRN

## 2020-07-11 NOTE — Progress Notes (Signed)
Rx refilled. No furthre refills until patient follows up in the office.

## 2020-07-12 ENCOUNTER — Other Ambulatory Visit: Payer: Self-pay | Admitting: Podiatry

## 2020-07-12 DIAGNOSIS — S92902K Unspecified fracture of left foot, subsequent encounter for fracture with nonunion: Secondary | ICD-10-CM

## 2020-07-17 ENCOUNTER — Telehealth: Payer: Self-pay

## 2020-07-17 NOTE — Telephone Encounter (Signed)
Patient needs to be seen for refills

## 2020-07-17 NOTE — Telephone Encounter (Signed)
Pt was informed he can not get RF until he sees Dr. March Rummage

## 2020-07-17 NOTE — Telephone Encounter (Signed)
Pt called requesting pain medication RF

## 2020-07-18 ENCOUNTER — Ambulatory Visit (INDEPENDENT_AMBULATORY_CARE_PROVIDER_SITE_OTHER): Payer: BLUE CROSS/BLUE SHIELD | Admitting: Podiatry

## 2020-07-18 ENCOUNTER — Other Ambulatory Visit: Payer: Self-pay | Admitting: Podiatry

## 2020-07-18 ENCOUNTER — Ambulatory Visit (INDEPENDENT_AMBULATORY_CARE_PROVIDER_SITE_OTHER): Payer: BLUE CROSS/BLUE SHIELD

## 2020-07-18 ENCOUNTER — Other Ambulatory Visit: Payer: Self-pay

## 2020-07-18 ENCOUNTER — Encounter: Payer: Self-pay | Admitting: Podiatry

## 2020-07-18 DIAGNOSIS — S92902K Unspecified fracture of left foot, subsequent encounter for fracture with nonunion: Secondary | ICD-10-CM

## 2020-07-18 MED ORDER — OXYCODONE-ACETAMINOPHEN 5-325 MG PO TABS: 1.0000 | ORAL_TABLET | Freq: Three times a day (TID) | ORAL | 0 refills | Status: DC | PRN

## 2020-07-18 NOTE — Progress Notes (Signed)
  Subjective:  Patient ID: Ivan Brown, male    DOB: 01/09/1971,  MRN: [id number]  Chief Complaint  Patient presents with  . Foot Injury    F/U Lt fx Zpt. states," the whole bottom of my foot is still sore and ankle is still fsore; 6/*10 sharp pains." - no swelling - not using crutches due to sores underarm tx: boot, PT and elevation    49 y.o. male presents with the above complaint. Still having severe pain.  Objective:  Physical Exam: warm, good capillary refill, no trophic changes or ulcerative lesions, normal DP and PT pulses and normal sensory exam. Left Foot: Slight pain plantar 5th met base, no proximal tenderness or at the peroneal tendons.    Assessment:   1. 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  Fracture 5th Metatarsal base, sequela; peroneal tendonitis -Dispense Trilock brace. Ok to d/c crutches and boot. -Continue PT -Refill pain Rx. Work on weaning off of medicine.  Return in about 4 weeks (around 08/15/2020) for Fracture f/u left no XRs.

## 2020-07-27 ENCOUNTER — Other Ambulatory Visit: Payer: Self-pay | Admitting: Podiatry

## 2020-07-27 ENCOUNTER — Telehealth: Payer: Self-pay | Admitting: Podiatry

## 2020-07-27 MED ORDER — OXYCODONE-ACETAMINOPHEN 5-325 MG PO TABS: 1.0000 | ORAL_TABLET | Freq: Three times a day (TID) | ORAL | 0 refills | Status: DC | PRN

## 2020-07-27 NOTE — Telephone Encounter (Signed)
-----   Message from [place] sent at 07/27/2020  2:54 PM EDT ----- Hi-this is a Dr March Rummage patient and is requesting pain medicine and he just got a refill on 07/18/2020. Lattie Haw

## 2020-07-27 NOTE — Telephone Encounter (Signed)
Sent!

## 2020-08-02 ENCOUNTER — Telehealth: Payer: Self-pay

## 2020-08-02 NOTE — Telephone Encounter (Signed)
Pt called stating his foot has been burning a lot on the bottom, with redness and more pain. Pt states he has been using heating pads for treatemetn with no relief. Pt would like to have recommendations on what to do.

## 2020-08-02 NOTE — Telephone Encounter (Signed)
Recommend he can ice the area or put a water bottle in the freezer and roll it underneath his foot. It's probably related to putting more weight back onto his foot

## 2020-08-03 NOTE — Telephone Encounter (Signed)
Contacted pt back and advised him of Dr. Eleanora Neighbor recommendation from 08/02/20 at 6:51PM

## 2020-08-07 ENCOUNTER — Telehealth: Payer: Self-pay

## 2020-08-07 MED ORDER — OXYCODONE-ACETAMINOPHEN 5-325 MG PO TABS: 1.0000 | ORAL_TABLET | Freq: Three times a day (TID) | ORAL | 0 refills | Status: DC | PRN

## 2020-08-07 NOTE — Telephone Encounter (Signed)
Pt called requesting a RF on pain medication  

## 2020-08-07 NOTE — Addendum Note (Signed)
Addended by: Hardie Pulley on: 08/07/2020 04:56 PM   Modules accepted: Orders

## 2020-08-07 NOTE — Telephone Encounter (Signed)
LVM to pt informing of pain RF sent to his pharmacy

## 2020-08-17 ENCOUNTER — Ambulatory Visit (INDEPENDENT_AMBULATORY_CARE_PROVIDER_SITE_OTHER): Payer: BLUE CROSS/BLUE SHIELD | Admitting: Podiatry

## 2020-08-17 ENCOUNTER — Other Ambulatory Visit: Payer: Self-pay

## 2020-08-17 ENCOUNTER — Encounter: Payer: Self-pay | Admitting: Podiatry

## 2020-08-17 DIAGNOSIS — M79672 Pain in left foot: Secondary | ICD-10-CM | POA: Diagnosis not present

## 2020-08-17 DIAGNOSIS — M778 Other enthesopathies, not elsewhere classified: Secondary | ICD-10-CM | POA: Diagnosis not present

## 2020-08-17 DIAGNOSIS — S92902K Unspecified fracture of left foot, subsequent encounter for fracture with nonunion: Secondary | ICD-10-CM

## 2020-08-17 DIAGNOSIS — S9782XD Crushing injury of left foot, subsequent encounter: Secondary | ICD-10-CM

## 2020-08-17 MED ORDER — OXYCODONE-ACETAMINOPHEN 5-325 MG PO TABS: 1.0000 | ORAL_TABLET | Freq: Three times a day (TID) | ORAL | 0 refills | Status: DC | PRN

## 2020-08-17 NOTE — Progress Notes (Addendum)
  Subjective:  Patient ID: Ivan Brown, male    DOB: 12/22/1971,  MRN: [id number]  No chief complaint on file.  49 y.o. male presents with the above complaint. Still thinks everything is about the same. Still has pain at the center of the foot on the bottom and where the fracture was. Using trilock brace and tennis shoes, recently purchased new boots to see if that would help with the pain he is experiencing but it has not.  Reports numbness and tingling at the area of injury  Objective:  Physical Exam: warm, good capillary refill, no trophic changes or ulcerative lesions, normal DP and PT pulses and normal sensory exam. Left Foot: Continued pain plantar 5th met base, no proximal tenderness or at the peroneal tendons.    Assessment:   1. 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 Crushing injury of left foot, subsequent encounter   3. Pain in left foot   4. Capsulitis of foot, left    Plan:  Patient was evaluated and treated and all questions answered.  Fracture 5th Metatarsal base, sequela; peroneal tendonitis -Continue WBAT -The fracture appears healed but he is still having a lot of pain sub 5th met base. States he has only been able to walk on it a max of 1.5 hours. -Will cast for CMOs with modification to offload the 5th met base. -Restart PT. New Rx written -Refilled pain medication -Ok for return to work. 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  -Return to work with modified restrictions - no standing or walking >1 hour. Allow sedentary tasks.  No follow-ups on file.

## 2020-08-22 ENCOUNTER — Telehealth: Payer: Self-pay | Admitting: Podiatry

## 2020-08-22 NOTE — Telephone Encounter (Signed)
Ivan Brown of [facility] (representing [employer]) wants to know if we will provide orthotics or if they need to find patient a vendor. Return call number: [phone], extension [phone]. Office hours M-5 8:30a-5:15p EST.

## 2020-08-23 ENCOUNTER — Telehealth (INDEPENDENT_AMBULATORY_CARE_PROVIDER_SITE_OTHER): Payer: BLUE CROSS/BLUE SHIELD | Admitting: Podiatry

## 2020-08-23 ENCOUNTER — Telehealth: Payer: Self-pay | Admitting: Podiatry

## 2020-08-23 NOTE — Telephone Encounter (Signed)
Jerrye Bushy., Nurse Case Manager at [facility] called. Authorization was approved for orthotics provided we are in-network with [facility].   Judson Roch is getting Mr. Maxie Better rescheduled with physical therapy.   Judson Roch is requesting clinical notes from patient's visits. Fax number is [phone].

## 2020-08-23 NOTE — Telephone Encounter (Signed)
Contacted Mendel Ryder of [facility] and informed her that we will provide the patient with the orthotics. Mendel Ryder stated she is working to obtain authorization for the L3020 X2 and that she will follow-up to provide a purchase order upon approval.

## 2020-08-23 NOTE — Telephone Encounter (Signed)
We will provide

## 2020-08-23 NOTE — Telephone Encounter (Signed)
Patient LVM requesting pain medication refill.

## 2020-08-23 NOTE — Telephone Encounter (Signed)
Can one of  you call her and let her know? Thanks!

## 2020-08-24 ENCOUNTER — Telehealth: Payer: Self-pay | Admitting: Podiatry

## 2020-08-24 MED ORDER — HYDROCODONE-ACETAMINOPHEN 5-325 MG PO TABS: 1.0000 | ORAL_TABLET | Freq: Four times a day (QID) | ORAL | 0 refills | Status: DC | PRN

## 2020-08-24 NOTE — Addendum Note (Signed)
Addended by: Hardie Pulley on: 08/24/2020 08:53 AM   Modules accepted: Orders

## 2020-08-24 NOTE — Telephone Encounter (Signed)
Patient called LVM for Ivonne to call him back. I phoned patient back at 2:03 pm and he wanted to know if his pain med refill had been called in. I informed him the prescription had been sent by Dr. March Rummage this morning. Patient then asked about his next appt with Dr. March Rummage. Informed him next appt was Sept 27th at 8:15 am in [place].

## 2020-08-24 NOTE — Telephone Encounter (Signed)
Refilled pain medication but will begin taper down to norco

## 2020-08-24 NOTE — Telephone Encounter (Signed)
Can we fax records requested

## 2020-08-24 NOTE — Telephone Encounter (Signed)
Ivan Brown I know you were working on this yesterday, is there anything you need me to do or is it finished? Thank you

## 2020-08-25 NOTE — Telephone Encounter (Signed)
I was working on the orthotics authorization with [facility]. I have not sent patient's clinicals for PT, can you send them please to the fax number provided. Thank you

## 2020-08-25 NOTE — Telephone Encounter (Signed)
Last office note has been faxed to [facility].

## 2020-09-18 ENCOUNTER — Other Ambulatory Visit: Payer: Self-pay

## 2020-09-18 ENCOUNTER — Ambulatory Visit (INDEPENDENT_AMBULATORY_CARE_PROVIDER_SITE_OTHER): Admitting: Podiatry

## 2020-09-18 ENCOUNTER — Encounter: Payer: Self-pay | Admitting: Podiatry

## 2020-09-18 DIAGNOSIS — S9782XD Crushing injury of left foot, subsequent encounter: Secondary | ICD-10-CM | POA: Diagnosis not present

## 2020-09-18 DIAGNOSIS — S92902K Unspecified fracture of left foot, subsequent encounter for fracture with nonunion: Secondary | ICD-10-CM | POA: Diagnosis not present

## 2020-09-18 NOTE — Patient Instructions (Signed)

## 2020-09-18 NOTE — Progress Notes (Signed)
  Subjective:  Patient ID: Ivan Brown, male    DOB: [birth year],  MRN: [id number]  Chief Complaint  Patient presents with  . Foot Problem    i am here to get the inserts    49 y.o. male presents with the above complaint. States he still has pain that prevents him from working a full day at work. States that he had to be dismissed from work several times last week because of the pain.  Objective:  Physical Exam: warm, good capillary refill, no trophic changes or ulcerative lesions, normal DP and PT pulses and normal sensory exam. Left Foot: Continued pain plantar 5th met base, no proximal tenderness or at the peroneal tendons.    Assessment:   1. 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 Crushing injury of left foot, subsequent encounter    Plan:  Patient was evaluated and treated and all questions answered.  Fracture 5th Metatarsal base, sequela; peroneal tendonitis -Continue WBAT -Dispensed offloading inserts. -Discussed no refill of pain medication as we are working to wean him off of the medication. Currently on Norco 5. -Continue PT -At this point we have maximized all options and should pain persist we can consider permanent impairment.   No follow-ups on file.

## 2020-09-27 ENCOUNTER — Telehealth: Payer: Self-pay | Admitting: *Deleted

## 2020-09-27 ENCOUNTER — Other Ambulatory Visit: Payer: Self-pay | Admitting: Podiatry

## 2020-09-27 MED ORDER — HYDROCODONE-ACETAMINOPHEN 5-325 MG PO TABS: 1.0000 | ORAL_TABLET | Freq: Three times a day (TID) | ORAL | 0 refills | Status: DC | PRN

## 2020-09-27 NOTE — Telephone Encounter (Signed)
Called and relayed the message per Dr March Rummage. Lattie Haw

## 2020-09-27 NOTE — Telephone Encounter (Signed)
-----   Message from Evelina Bucy, DPM sent at 09/27/2020  8:09 AM EDT ----- Refilled please inform ----- Message ----- From: Viviana Simpler, PMAC Sent: 09/27/2020   7:41 AM EDT To: Evelina Bucy, DPM  Hey patient called and stated that he would like a refill of the pain medicine. Lattie Haw

## 2020-09-27 NOTE — Progress Notes (Signed)
Rx refilled. Changed sig to q8 hours in effort to continue titrating down.

## 2020-10-19 ENCOUNTER — Other Ambulatory Visit: Payer: Self-pay

## 2020-10-19 ENCOUNTER — Ambulatory Visit (INDEPENDENT_AMBULATORY_CARE_PROVIDER_SITE_OTHER): Admitting: Podiatry

## 2020-10-19 ENCOUNTER — Encounter: Payer: Self-pay | Admitting: Podiatry

## 2020-10-19 DIAGNOSIS — S92902K Unspecified fracture of left foot, subsequent encounter for fracture with nonunion: Secondary | ICD-10-CM

## 2020-10-19 DIAGNOSIS — S9782XD Crushing injury of left foot, subsequent encounter: Secondary | ICD-10-CM

## 2020-10-19 MED ORDER — HYDROCODONE-ACETAMINOPHEN 5-325 MG PO TABS: 0.5000 | ORAL_TABLET | Freq: Three times a day (TID) | ORAL | 0 refills | Status: AC | PRN

## 2020-10-20 NOTE — Progress Notes (Signed)
  Subjective:  Patient ID: Ivan Brown, male    DOB: 1971/12/16,  MRN: [id number]  Chief Complaint  Patient presents with  . Foot Problem    i need my pain medicine and i called and no body sent it to the pharmacy and the left foot hurts and i can only be on my foot for about an hour    49 y.o. male presents with the above complaint. States he can only be on his foot for an hour while at work, states he has not noticed a difference with the orthotics. Requests pain med refill.  Objective:  Physical Exam: warm, good capillary refill, no trophic changes or ulcerative lesions, normal DP and PT pulses and normal sensory exam. Left Foot: Continued pain plantar 5th met base, no proximal tenderness or at the peroneal tendons.    Assessment:   1. 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 Crushing injury of left foot, subsequent encounter    Plan:  Patient was evaluated and treated and all questions answered.  Fracture 5th Metatarsal base, sequela; peroneal tendonitis -Continue WBAT -Orthotics offloaded -Continue PT -Refilled pain medication, again tapered dose. Discussed with patient he will receive no further refills. Patient verbalized understanding. -At this point we have maximized all options. I do not believe the patient to be malingering but his injury seems to have healed. I do not think surgical intervention is warranted. Will consider repeat imaging or permanent impairment.   No follow-ups on file.

## 2020-10-23 ENCOUNTER — Encounter: Payer: Self-pay | Admitting: Podiatry

## 2020-10-23 ENCOUNTER — Telehealth: Payer: Self-pay | Admitting: Podiatry

## 2020-10-23 NOTE — Telephone Encounter (Signed)
Continue current restrictions for 2 more weeks then full duty

## 2020-10-23 NOTE — Telephone Encounter (Signed)
w/c requesting work note for 10-19-20 visit.  Pt did not request a note at visit, do you want to continue with current restrictions and if so will it be indefinite.

## 2020-10-26 ENCOUNTER — Telehealth: Payer: Self-pay | Admitting: *Deleted

## 2020-10-26 ENCOUNTER — Telehealth: Payer: Self-pay | Admitting: Podiatry

## 2020-10-26 NOTE — Telephone Encounter (Signed)
[facility] called stating they needed a referral for workers comp. I called them back to have them fax it to me, but they spoke with Levada Dy in medical records and she handled it.

## 2020-10-26 NOTE — Telephone Encounter (Signed)
Santiago Glad from [employer] called and needed a request for more physical therapy for patient and per Dr March Rummage ok'd to have 3 times a week for 6 weeks and I faxed to [phone]. Lattie Haw

## 2020-12-04 ENCOUNTER — Ambulatory Visit (INDEPENDENT_AMBULATORY_CARE_PROVIDER_SITE_OTHER): Payer: BLUE CROSS/BLUE SHIELD | Admitting: Podiatry

## 2020-12-04 DIAGNOSIS — Z5329 Procedure and treatment not carried out because of patient's decision for other reasons: Secondary | ICD-10-CM

## 2020-12-04 NOTE — Progress Notes (Signed)
2nd no-show within 6 months.

## 2020-12-28 ENCOUNTER — Ambulatory Visit: Payer: Self-pay | Admitting: Podiatry

## 2021-01-01 ENCOUNTER — Ambulatory Visit (INDEPENDENT_AMBULATORY_CARE_PROVIDER_SITE_OTHER): Admitting: Podiatry

## 2021-01-01 ENCOUNTER — Other Ambulatory Visit: Payer: Self-pay

## 2021-01-01 DIAGNOSIS — S92902K Unspecified fracture of left foot, subsequent encounter for fracture with nonunion: Secondary | ICD-10-CM

## 2021-01-01 DIAGNOSIS — S9782XD Crushing injury of left foot, subsequent encounter: Secondary | ICD-10-CM | POA: Diagnosis not present

## 2021-01-01 DIAGNOSIS — M778 Other enthesopathies, not elsewhere classified: Secondary | ICD-10-CM | POA: Diagnosis not present

## 2021-01-01 NOTE — Progress Notes (Signed)
  Subjective:  Patient ID: Ivan Brown, male    DOB: 10-Jun-1971,  MRN: [id number]  Chief Complaint  Patient presents with  . Pain    Chronic pain isnt any better Pt stats he just wants to be released from [place], he is living in [place]  Tx: OTC meds and icing   50 y.o. male presents with the above complaint. Hx confirmed with patient.  Objective:  Physical Exam: warm, good capillary refill, no trophic changes or ulcerative lesions, normal DP and PT pulses and normal sensory exam. Left Foot: Continued pain plantar 5th met base, no proximal tenderness or at the peroneal tendons.    Assessment:   1. 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 Crushing injury of left foot, subsequent encounter   3. Capsulitis of foot, left    Plan:  Patient was evaluated and treated and all questions answered.  Fracture 5th Metatarsal base, sequela; peroneal tendonitis -At this point will release patient as he now lives out of state. Recommend patient consider second opinion in [place].  No follow-ups on file.

## 2021-09-03 ENCOUNTER — Ambulatory Visit (INDEPENDENT_AMBULATORY_CARE_PROVIDER_SITE_OTHER): Admitting: Podiatry

## 2021-09-03 DIAGNOSIS — Z5329 Procedure and treatment not carried out because of patient's decision for other reasons: Secondary | ICD-10-CM

## 2021-09-03 NOTE — Progress Notes (Signed)
No show for appt. 

## 2021-09-24 ENCOUNTER — Ambulatory Visit: Payer: BLUE CROSS/BLUE SHIELD | Admitting: Podiatry

## 2021-10-29 ENCOUNTER — Other Ambulatory Visit: Payer: Self-pay

## 2021-10-29 ENCOUNTER — Ambulatory Visit (INDEPENDENT_AMBULATORY_CARE_PROVIDER_SITE_OTHER): Payer: Self-pay

## 2021-10-29 ENCOUNTER — Ambulatory Visit (INDEPENDENT_AMBULATORY_CARE_PROVIDER_SITE_OTHER): Admitting: Podiatry

## 2021-10-29 DIAGNOSIS — M7672 Peroneal tendinitis, left leg: Secondary | ICD-10-CM

## 2021-10-29 DIAGNOSIS — S92902S Unspecified fracture of left foot, sequela: Secondary | ICD-10-CM | POA: Diagnosis not present

## 2021-10-29 DIAGNOSIS — M79672 Pain in left foot: Secondary | ICD-10-CM

## 2021-10-29 NOTE — Progress Notes (Signed)
  Subjective:  Patient ID: Ivan Brown, male    DOB: 03/09/1971,  MRN: [id number]  Chief Complaint  Patient presents with   Pain    Pt states he still having pain at his Lt lateral foot - worse in AM - w/ swelling tx: resting    50 y.o. male presents with the above complaint. Hx confirmed with patient.  Objective:  Physical Exam: warm, good capillary refill, no trophic changes or ulcerative lesions, normal DP and PT pulses and normal sensory exam. Left Foot: Continued pain 5th met base and shaft, pain at the peroneal tendons.    Assessment:   1. Peroneal tendonitis, left   2. Closed fracture of left foot, sequela    Plan:  Patient was evaluated and treated and all questions answered.  Fracture 5th Metatarsal base, sequela; peroneal tendonitis -He has had continued pain that has never resolved. Will order repeat MRI for evaluation -Dispense new CAM boot for immobilization -F/u after MRI  No follow-ups on file.

## 2021-11-02 ENCOUNTER — Other Ambulatory Visit: Payer: Self-pay | Admitting: Podiatry

## 2021-11-02 DIAGNOSIS — M7672 Peroneal tendinitis, left leg: Secondary | ICD-10-CM

## 2021-11-19 ENCOUNTER — Ambulatory Visit
Admission: RE | Admit: 2021-11-19 | Discharge: 2021-11-19 | Disposition: A | Discharge status: Home / Self Care | Source: Ambulatory Visit | Attending: Podiatry | Admitting: Podiatry

## 2021-11-19 ENCOUNTER — Other Ambulatory Visit: Payer: Self-pay

## 2021-11-19 ENCOUNTER — Ambulatory Visit (INDEPENDENT_AMBULATORY_CARE_PROVIDER_SITE_OTHER): Admitting: Podiatry

## 2021-11-19 DIAGNOSIS — M7672 Peroneal tendinitis, left leg: Secondary | ICD-10-CM

## 2021-11-19 DIAGNOSIS — S92902S Unspecified fracture of left foot, sequela: Secondary | ICD-10-CM

## 2021-11-19 NOTE — Progress Notes (Signed)
  Subjective:  Patient ID: Ivan Brown, male    DOB: 04-17-1971,  MRN: [id number]  Chief Complaint  Patient presents with   Results    Review MRI results -pt states," cery sore not as bad with the boot but worse without the boot; 5/10 throbbing pain.": -0 w/ swelling T%x: boot and icing    50 y.o. male presents with the above complaint. Hx confirmed with patient. Had MRI performed this AM.  Objective:  Physical Exam: warm, good capillary refill, no trophic changes or ulcerative lesions, normal DP and PT pulses and normal sensory exam. Left Foot: Continued pain 5th met base and shaft, pain at the peroneal tendons.    MRI 11/28  IMPRESSION: 1. Small osteochondral lesion of the medial talar dome with adjacent marrow edema. 2. Accentuated T2 signal along the deep tibiotalar portion of the deltoid ligament which may be sprained. 3. Mildly thin/attenuated anterior talofibular ligament but without overt discontinuity/tear. 4. Mild distal tibialis posterior tenosynovitis without overt tendinopathy. 5. Small focus of susceptibility artifact along the plantar cutaneous surface below the medial Lisfranc joint, probably incidental. Assessment:   1. Peroneal tendonitis, left   2. Closed fracture of left foot, sequela    Plan:  Patient was evaluated and treated and all questions answered.  Fracture 5th Metatarsal base, sequela; peroneal tendonitis -MRI reviewed. Tendon abnormality noted but not read by radiologist. Will review -Continue CAM boot as it is helping. Will review MRI further with patient next visit and consider options.  Return in about 2 weeks (around 12/03/2021) for Tendonitis f/u.

## 2021-12-03 ENCOUNTER — Ambulatory Visit: Admitting: Podiatry

## 2021-12-03 ENCOUNTER — Telehealth: Payer: Self-pay | Admitting: Podiatry

## 2021-12-03 NOTE — Telephone Encounter (Signed)
Does not appear that he will need surgery at this time. Please inform

## 2021-12-03 NOTE — Telephone Encounter (Signed)
Patient called and cancelled his appt, he is out of state, would like to know if he needs surgery or not?

## 2021-12-27 ENCOUNTER — Ambulatory Visit: Admitting: Podiatry

## 2022-01-01 ENCOUNTER — Telehealth: Payer: Self-pay | Admitting: *Deleted

## 2022-01-01 NOTE — Telephone Encounter (Signed)
Rayann Heman called from [employer] and needed the medical report from 12-27-2021 and I stated that the patient did not show up and the claim number is [id number] and phone number is [phone] and the fax number is [phone]. Lattie Haw

## 2022-01-21 ENCOUNTER — Encounter: Payer: Self-pay | Admitting: Podiatry

## 2022-01-21 ENCOUNTER — Ambulatory Visit (INDEPENDENT_AMBULATORY_CARE_PROVIDER_SITE_OTHER): Admitting: Podiatry

## 2022-01-21 ENCOUNTER — Other Ambulatory Visit: Payer: Self-pay

## 2022-01-21 DIAGNOSIS — M7672 Peroneal tendinitis, left leg: Secondary | ICD-10-CM

## 2022-01-21 DIAGNOSIS — S92902S Unspecified fracture of left foot, sequela: Secondary | ICD-10-CM | POA: Diagnosis not present

## 2022-01-28 NOTE — Progress Notes (Signed)
Subjective:  Patient ID: Ivan Brown, male    DOB: 1971/09/24,  MRN: [id number]  Chief Complaint  Patient presents with   Foot Pain    My left foot is sore and I am here for the MRI results   51 y.o. male presents with the above complaint. Hx confirmed with patient.  Still having pain in the foot but is able to work more.  Recently out of state selling cars.  Objective:  Physical Exam: warm, good capillary refill, no trophic changes or ulcerative lesions, normal DP and PT pulses and normal sensory exam. Left Foot: Continued pain 5th met base and shaft, pain at the peroneal tendons.    MRI 11/28  IMPRESSION: 1. Small osteochondral lesion of the medial talar dome with adjacent marrow edema. 2. Accentuated T2 signal along the deep tibiotalar portion of the deltoid ligament which may be sprained. 3. Mildly thin/attenuated anterior talofibular ligament but without overt discontinuity/tear. 4. Mild distal tibialis posterior tenosynovitis without overt tendinopathy. 5. Small focus of susceptibility artifact along the plantar cutaneous surface below the medial Lisfranc joint, probably incidental. Assessment:   1. Peroneal tendonitis, left   2. Closed fracture of left foot, sequela     Plan:  Patient was evaluated and treated and all questions answered.  Fracture 5th Metatarsal base, sequela; peroneal tendonitis -Again reviewed MRI with patient. -No indications for surgical intervention.  I think this point he has reached maximal medical improvement.  I will discharge him with follow-up only as needed.  Should pain persist we could consider pain management  Return if symptoms worsen or fail to improve.

## 2022-08-18 IMAGING — MR MR ANKLE*L* W/O CM
5 series · 40 of 40 positions shown · non-contrast
Comparison: Multiple exams, including radiographs from 10/29/2021

CLINICAL DATA: Left ankle pain laterally, worsening. Remote injury.

EXAM:
MRI OF THE LEFT ANKLE WITHOUT CONTRAST
TECHNIQUE: Multiplanar, multisequence MR imaging of the ankle was performed. No
intravenous contrast was administered.

[Series 5: PD fat-sat · axial · 3.0mm · 0.53mm/px · z∈[-32,+104]mm · 9 of 36 slices shown]
[im 1/36]
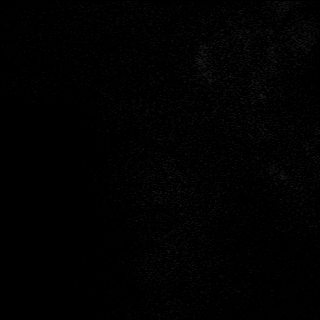
[im 5/36]
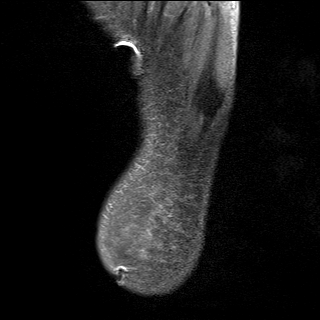
[im 9/36]
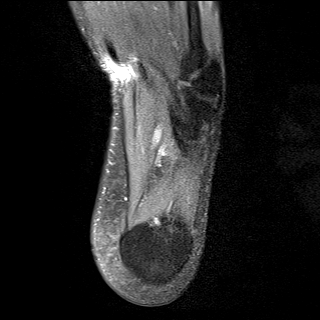
[im 14/36]
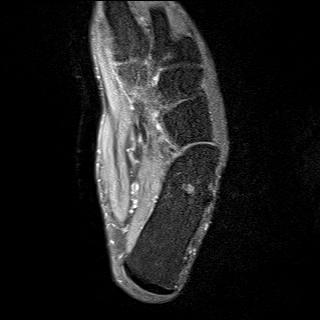
[im 18/36]
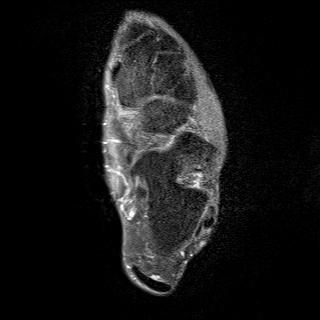
[im 22/36]
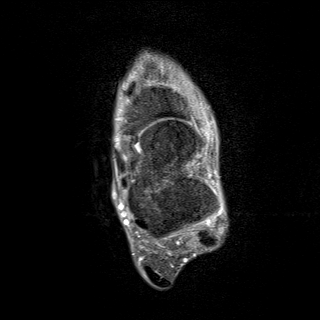
[im 27/36]
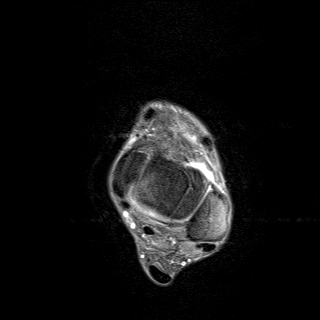
[im 31/36]
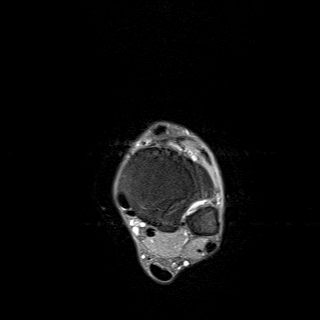
[im 36/36]
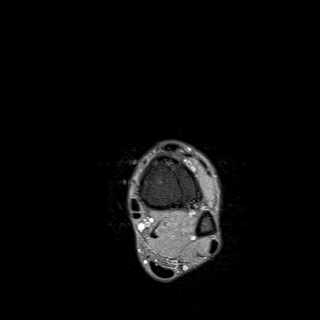

[Series 7: STIR · sagittal · 4.0mm · 0.37mm/px · 6 of 24 slices shown]
[im 1/24]
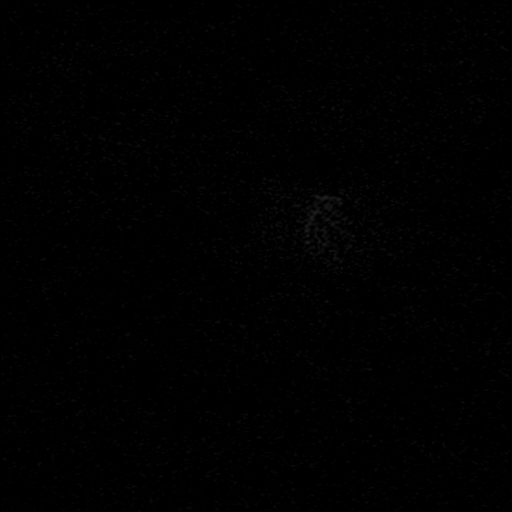
[im 5/24]
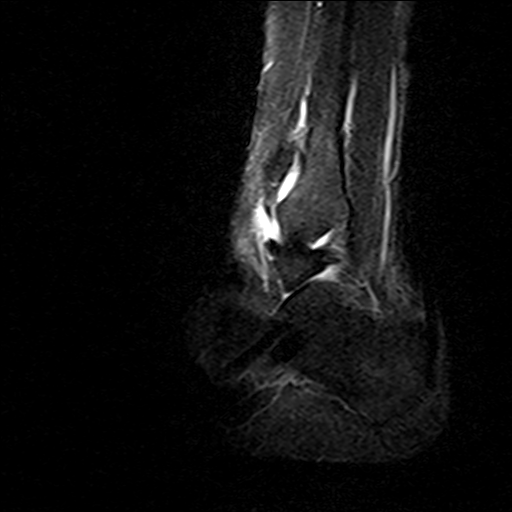
[im 10/24]
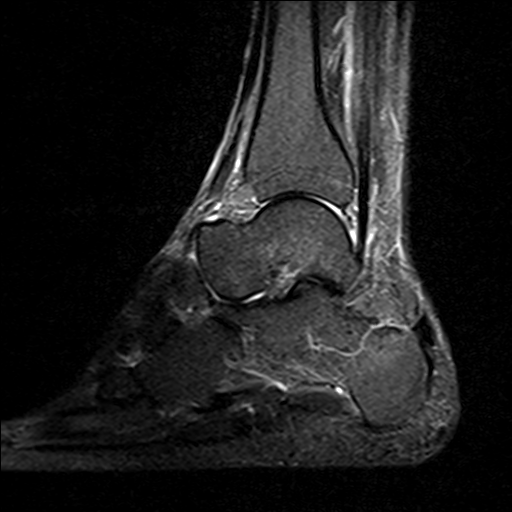
[im 14/24]
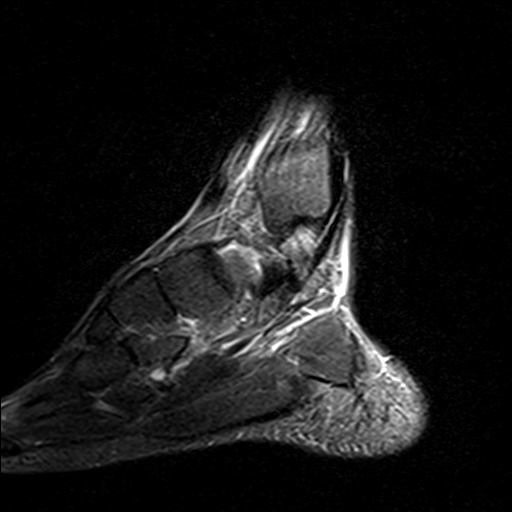
[im 19/24]
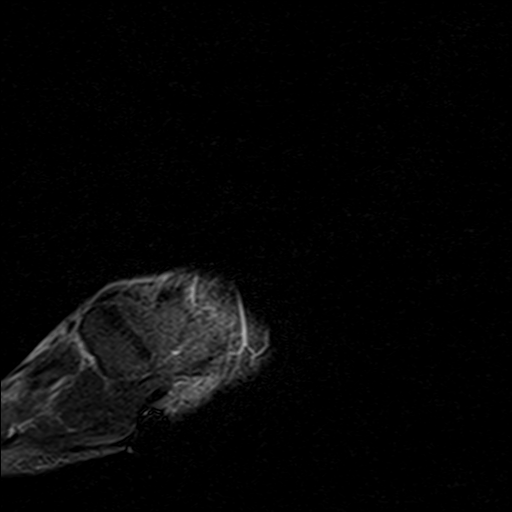
[im 24/24]
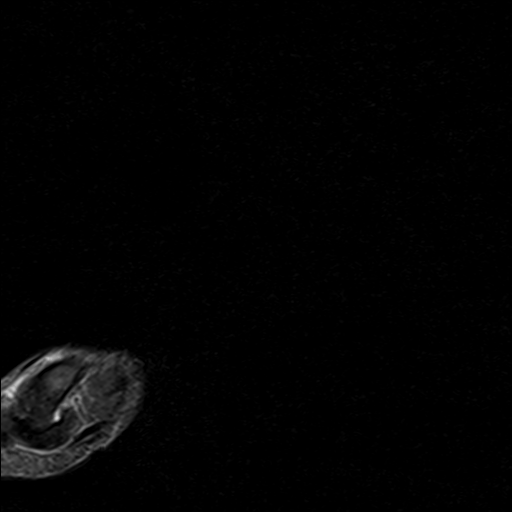

[Series 8: T1 · sagittal · 4.0mm · 0.56mm/px · 6 of 24 slices shown]
[im 1/24]
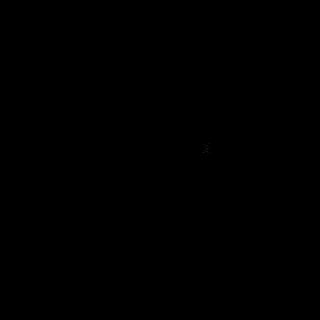
[im 5/24]
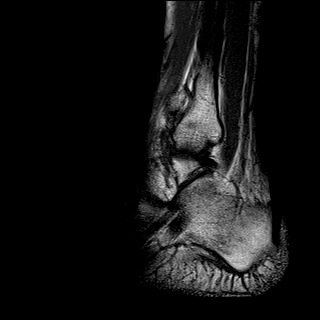
[im 10/24]
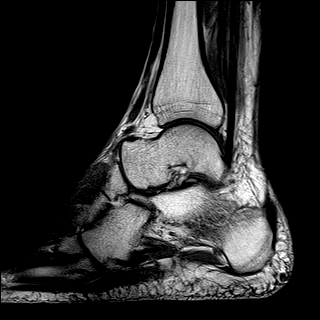
[im 14/24]
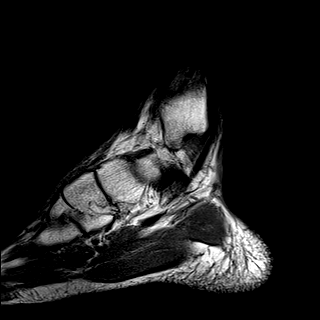
[im 19/24]
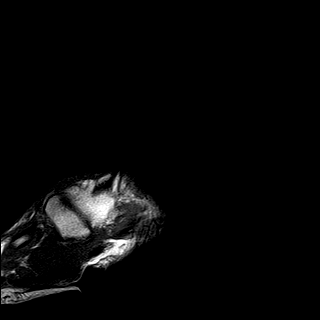
[im 24/24]
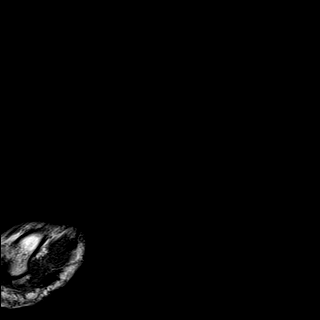

[Series 9: T2 fat-sat · coronal · 3.0mm · 0.50mm/px · 10 of 42 slices shown (1 of 2)]
[im 1/42]
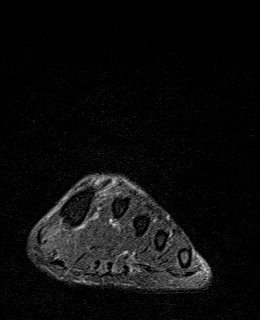
[im 5/42]
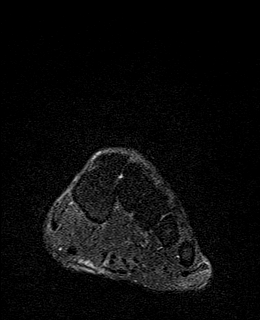
[im 10/42]
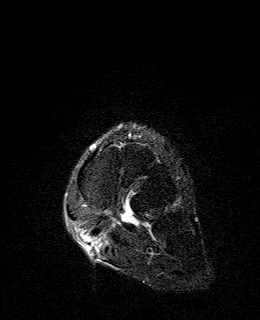
[im 14/42]
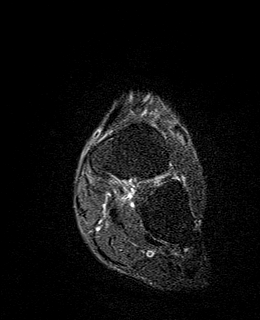
[im 19/42]
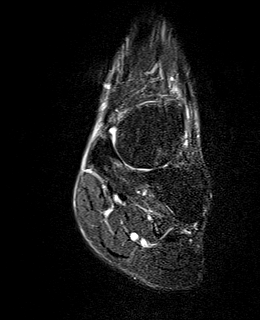
[im 23/42]
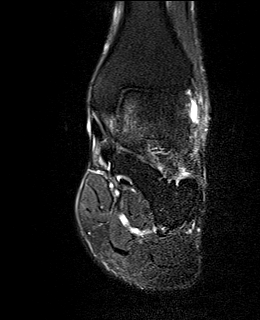
[im 28/42]
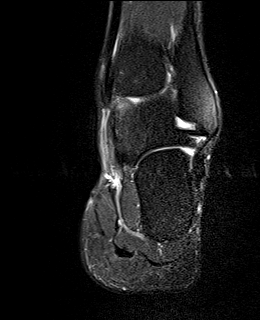
[im 32/42]
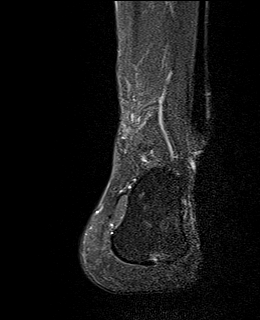
[im 37/42]
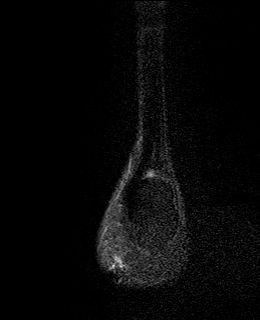
[im 42/42]
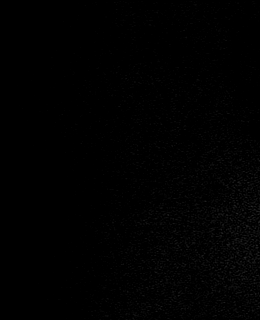

[Series 10: T2 fat-sat · axial · 3.0mm · 0.66mm/px · z∈[-32,+104]mm · 9 of 36 slices shown (2 of 2)]
[im 1/36]
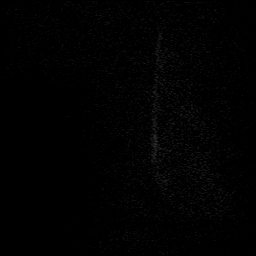
[im 5/36]
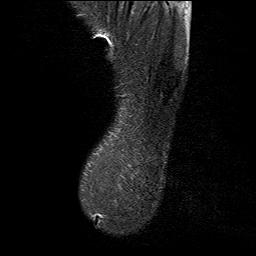
[im 9/36]
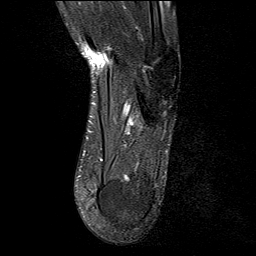
[im 14/36]
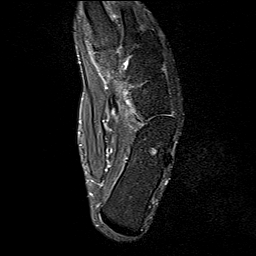
[im 18/36]
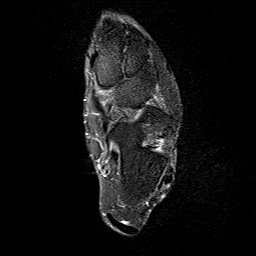
[im 22/36]
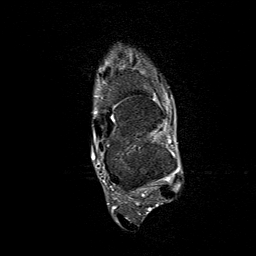
[im 27/36]
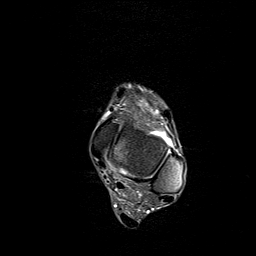
[im 31/36]
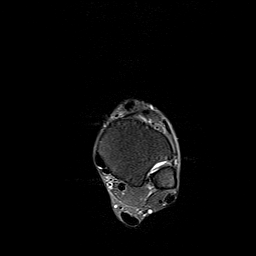
[im 36/36]
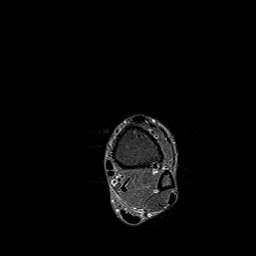

[40 of 40 positions shown; findings below may reference images not displayed]

FINDINGS: Despite efforts by the technologist and patient, motion artifact is
present on today's exam and could not be eliminated. This reduces
exam sensitivity and specificity.

TENDONS

Peroneal: Unremarkable

Posteromedial: Mild distal tibialis posterior tenosynovitis.

Anterior: Unremarkable

Achilles: Unremarkable

Plantar Fascia: Unremarkable

LIGAMENTS

Lateral: Mildly thinned anterior talofibular ligament although
discontinuous.

Medial: Subtle accentuation of T2 signal in the deep tibiotalar
portion of the deltoid ligament on image 19 of series 9 which may be
mildly sprain.

CARTILAGE

Ankle Joint: 0.7 cm osteochondral lesion of the medial talar dome on
image 13 series 8 associated with marrow edema as on image 18 series
9.

Subtalar Joints/Sinus Tarsi: Unremarkable

Bones: Exaggerated longitudinal arch of the foot but this is
probably based on positioning rather than representative of Vigfus
Paulus N.

Other: Focus of artifact along the plantar medial skin surface below
the medial cuneiform. Common no metal foreign bodies corroborated in
this vicinity on recent radiographs of 10/29/2021.
IMPRESSION: 1. Small osteochondral lesion of the medial talar dome with adjacent
marrow edema.
2. Accentuated T2 signal along the deep tibiotalar portion of the
deltoid ligament which may be sprained.
3. Mildly thin/attenuated anterior talofibular ligament but without
overt discontinuity/tear.
4. Mild distal tibialis posterior tenosynovitis without overt
tendinopathy.
5. Small focus of susceptibility artifact along the plantar
cutaneous surface below the medial Lisfranc joint, probably
incidental.
